# Patient Record
Sex: Female | Born: 1945
Health system: Southern US, Community
[De-identification: ages and names within clinical notes are randomized; demographics above are authoritative.]

## PROBLEM LIST (undated history)

## (undated) DIAGNOSIS — G9389 Other specified disorders of brain: Secondary | ICD-10-CM

## (undated) DIAGNOSIS — M545 Low back pain, unspecified: Secondary | ICD-10-CM

## (undated) DIAGNOSIS — Z9889 Other specified postprocedural states: Secondary | ICD-10-CM

## (undated) DIAGNOSIS — G459 Transient cerebral ischemic attack, unspecified: Secondary | ICD-10-CM

## (undated) DIAGNOSIS — S2239XA Fracture of one rib, unspecified side, initial encounter for closed fracture: Secondary | ICD-10-CM

## (undated) DIAGNOSIS — M1990 Unspecified osteoarthritis, unspecified site: Secondary | ICD-10-CM

## (undated) DIAGNOSIS — R112 Nausea with vomiting, unspecified: Secondary | ICD-10-CM

## (undated) DIAGNOSIS — R011 Cardiac murmur, unspecified: Secondary | ICD-10-CM

## (undated) DIAGNOSIS — R51 Headache: Secondary | ICD-10-CM

## (undated) DIAGNOSIS — R4701 Aphasia: Secondary | ICD-10-CM

## (undated) DIAGNOSIS — C50911 Malignant neoplasm of unspecified site of right female breast: Secondary | ICD-10-CM

## (undated) DIAGNOSIS — R569 Unspecified convulsions: Secondary | ICD-10-CM

## (undated) DIAGNOSIS — E785 Hyperlipidemia, unspecified: Secondary | ICD-10-CM

## (undated) DIAGNOSIS — Z923 Personal history of irradiation: Secondary | ICD-10-CM

## (undated) DIAGNOSIS — C7931 Secondary malignant neoplasm of brain: Secondary | ICD-10-CM

## (undated) DIAGNOSIS — G9340 Encephalopathy, unspecified: Secondary | ICD-10-CM

## (undated) DIAGNOSIS — I639 Cerebral infarction, unspecified: Secondary | ICD-10-CM

## (undated) DIAGNOSIS — F0390 Unspecified dementia without behavioral disturbance: Secondary | ICD-10-CM

## (undated) DIAGNOSIS — Z853 Personal history of malignant neoplasm of breast: Secondary | ICD-10-CM

## (undated) DIAGNOSIS — M8008XA Age-related osteoporosis with current pathological fracture, vertebra(e), initial encounter for fracture: Secondary | ICD-10-CM

## (undated) DIAGNOSIS — R2689 Other abnormalities of gait and mobility: Secondary | ICD-10-CM

## (undated) DIAGNOSIS — M810 Age-related osteoporosis without current pathological fracture: Secondary | ICD-10-CM

## (undated) DIAGNOSIS — G8929 Other chronic pain: Secondary | ICD-10-CM

## (undated) DIAGNOSIS — R296 Repeated falls: Secondary | ICD-10-CM

## (undated) DIAGNOSIS — R270 Ataxia, unspecified: Secondary | ICD-10-CM

## (undated) DIAGNOSIS — S2232XA Fracture of one rib, left side, initial encounter for closed fracture: Secondary | ICD-10-CM

## (undated) DIAGNOSIS — C50919 Malignant neoplasm of unspecified site of unspecified female breast: Secondary | ICD-10-CM

## (undated) DIAGNOSIS — R519 Headache, unspecified: Secondary | ICD-10-CM

## (undated) DIAGNOSIS — K219 Gastro-esophageal reflux disease without esophagitis: Secondary | ICD-10-CM

## (undated) HISTORY — DX: Age-related osteoporosis with current pathological fracture, vertebra(e), initial encounter for fracture: M80.08XA

## (undated) HISTORY — DX: Encephalopathy, unspecified: G93.40

## (undated) HISTORY — DX: Fracture of one rib, left side, initial encounter for closed fracture: S22.32XA

## (undated) HISTORY — PX: CHOLECYSTECTOMY OPEN: SUR202

## (undated) HISTORY — DX: Personal history of malignant neoplasm of breast: Z85.3

## (undated) HISTORY — DX: Aphasia: R47.01

## (undated) HISTORY — DX: Transient cerebral ischemic attack, unspecified: G45.9

## (undated) HISTORY — DX: Repeated falls: R29.6

## (undated) HISTORY — DX: Age-related osteoporosis without current pathological fracture: M81.0

## (undated) HISTORY — DX: Personal history of irradiation: Z92.3

## (undated) HISTORY — DX: Secondary malignant neoplasm of brain: C79.31

## (undated) HISTORY — DX: Other abnormalities of gait and mobility: R26.89

## (undated) HISTORY — DX: Cerebral infarction, unspecified: I63.9

## (undated) HISTORY — PX: APPENDECTOMY: SHX54

## (undated) HISTORY — DX: Other specified disorders of brain: G93.89

## (undated) HISTORY — DX: Ataxia, unspecified: R27.0

## (undated) HISTORY — DX: Malignant neoplasm of unspecified site of right female breast: C50.911

## (undated) HISTORY — PX: BREAST SURGERY: SHX581

---

## 1983-12-11 HISTORY — PX: ABDOMINAL HYSTERECTOMY: SHX81

## 1999-12-11 DIAGNOSIS — C50911 Malignant neoplasm of unspecified site of right female breast: Secondary | ICD-10-CM

## 1999-12-11 HISTORY — PX: MASTECTOMY: SHX3

## 1999-12-11 HISTORY — DX: Malignant neoplasm of unspecified site of right female breast: C50.911

## 2002-12-10 DIAGNOSIS — Z923 Personal history of irradiation: Secondary | ICD-10-CM

## 2002-12-10 DIAGNOSIS — C50919 Malignant neoplasm of unspecified site of unspecified female breast: Secondary | ICD-10-CM

## 2002-12-10 HISTORY — PX: BRAIN TUMOR EXCISION: SHX577

## 2002-12-10 HISTORY — DX: Malignant neoplasm of unspecified site of unspecified female breast: C50.919

## 2002-12-10 HISTORY — DX: Personal history of irradiation: Z92.3

## 2013-05-01 DIAGNOSIS — C50911 Malignant neoplasm of unspecified site of right female breast: Secondary | ICD-10-CM

## 2013-05-01 HISTORY — DX: Malignant neoplasm of unspecified site of right female breast: C50.911

## 2015-11-16 DIAGNOSIS — G459 Transient cerebral ischemic attack, unspecified: Secondary | ICD-10-CM

## 2015-11-16 HISTORY — DX: Transient cerebral ischemic attack, unspecified: G45.9

## 2017-01-27 DIAGNOSIS — R4701 Aphasia: Secondary | ICD-10-CM | POA: Insufficient documentation

## 2017-01-27 HISTORY — DX: Aphasia: R47.01

## 2017-08-19 DIAGNOSIS — R270 Ataxia, unspecified: Secondary | ICD-10-CM

## 2017-08-19 HISTORY — DX: Ataxia, unspecified: R27.0

## 2017-08-19 LAB — HM HEPATITIS C SCREENING LAB: HM HEPATITIS C SCREENING: NEGATIVE

## 2017-08-26 ENCOUNTER — Emergency Department (HOSPITAL_BASED_OUTPATIENT_CLINIC_OR_DEPARTMENT_OTHER)
Admission: EM | Admit: 2017-08-26 | Discharge: 2017-08-26 | Disposition: A | Discharge status: Home / Self Care | Payer: Medicare Other | Attending: Emergency Medicine | Admitting: Emergency Medicine

## 2017-08-26 ENCOUNTER — Emergency Department (HOSPITAL_BASED_OUTPATIENT_CLINIC_OR_DEPARTMENT_OTHER): Payer: Medicare Other

## 2017-08-26 ENCOUNTER — Encounter (HOSPITAL_BASED_OUTPATIENT_CLINIC_OR_DEPARTMENT_OTHER): Payer: Self-pay | Admitting: Emergency Medicine

## 2017-08-26 DIAGNOSIS — Z853 Personal history of malignant neoplasm of breast: Secondary | ICD-10-CM | POA: Diagnosis not present

## 2017-08-26 DIAGNOSIS — Y929 Unspecified place or not applicable: Secondary | ICD-10-CM | POA: Insufficient documentation

## 2017-08-26 DIAGNOSIS — R296 Repeated falls: Secondary | ICD-10-CM | POA: Insufficient documentation

## 2017-08-26 DIAGNOSIS — Y999 Unspecified external cause status: Secondary | ICD-10-CM | POA: Insufficient documentation

## 2017-08-26 DIAGNOSIS — S20211A Contusion of right front wall of thorax, initial encounter: Secondary | ICD-10-CM | POA: Diagnosis not present

## 2017-08-26 DIAGNOSIS — S2249XA Multiple fractures of ribs, unspecified side, initial encounter for closed fracture: Secondary | ICD-10-CM

## 2017-08-26 DIAGNOSIS — R260 Ataxic gait: Secondary | ICD-10-CM | POA: Insufficient documentation

## 2017-08-26 DIAGNOSIS — W0110XA Fall on same level from slipping, tripping and stumbling with subsequent striking against unspecified object, initial encounter: Secondary | ICD-10-CM | POA: Diagnosis not present

## 2017-08-26 DIAGNOSIS — S29001A Unspecified injury of muscle and tendon of front wall of thorax, initial encounter: Secondary | ICD-10-CM | POA: Diagnosis present

## 2017-08-26 DIAGNOSIS — C7931 Secondary malignant neoplasm of brain: Secondary | ICD-10-CM | POA: Diagnosis not present

## 2017-08-26 DIAGNOSIS — Y9301 Activity, walking, marching and hiking: Secondary | ICD-10-CM | POA: Insufficient documentation

## 2017-08-26 HISTORY — DX: Secondary malignant neoplasm of brain: C79.31

## 2017-08-26 HISTORY — DX: Malignant neoplasm of unspecified site of unspecified female breast: C50.919

## 2017-08-26 HISTORY — DX: Multiple fractures of ribs, unspecified side, initial encounter for closed fracture: S22.49XA

## 2017-08-26 HISTORY — DX: Hyperlipidemia, unspecified: E78.5

## 2017-08-26 HISTORY — DX: Unspecified dementia, unspecified severity, without behavioral disturbance, psychotic disturbance, mood disturbance, and anxiety: F03.90

## 2017-08-26 LAB — CBC WITH DIFFERENTIAL/PLATELET
BASOS PCT: 0 %
Basophils Absolute: 0 10*3/uL (ref 0.0–0.1)
EOS ABS: 0.3 10*3/uL (ref 0.0–0.7)
EOS PCT: 3 %
HCT: 40.2 % (ref 36.0–46.0)
Hemoglobin: 13.2 g/dL (ref 12.0–15.0)
LYMPHS ABS: 2.1 10*3/uL (ref 0.7–4.0)
Lymphocytes Relative: 24 %
MCH: 28.4 pg (ref 26.0–34.0)
MCHC: 32.8 g/dL (ref 30.0–36.0)
MCV: 86.5 fL (ref 78.0–100.0)
MONO ABS: 0.6 10*3/uL (ref 0.1–1.0)
MONOS PCT: 7 %
Neutro Abs: 5.8 10*3/uL (ref 1.7–7.7)
Neutrophils Relative %: 66 %
PLATELETS: 296 10*3/uL (ref 150–400)
RBC: 4.65 MIL/uL (ref 3.87–5.11)
RDW: 14.9 % (ref 11.5–15.5)
WBC: 8.9 10*3/uL (ref 4.0–10.5)

## 2017-08-26 LAB — COMPREHENSIVE METABOLIC PANEL
ALK PHOS: 73 U/L (ref 38–126)
ALT: 19 U/L (ref 14–54)
AST: 22 U/L (ref 15–41)
Albumin: 3.9 g/dL (ref 3.5–5.0)
Anion gap: 7 (ref 5–15)
BUN: 16 mg/dL (ref 6–20)
CALCIUM: 8.9 mg/dL (ref 8.9–10.3)
CHLORIDE: 108 mmol/L (ref 101–111)
CO2: 26 mmol/L (ref 22–32)
CREATININE: 0.71 mg/dL (ref 0.44–1.00)
GFR calc Af Amer: >60 mL/min (ref >60)
GFR calc non Af Amer: >60 mL/min (ref >60)
GLUCOSE: 95 mg/dL (ref 65–99)
Potassium: 3.5 mmol/L (ref 3.5–5.1)
SODIUM: 141 mmol/L (ref 135–145)
Total Bilirubin: 0.8 mg/dL (ref 0.3–1.2)
Total Protein: 7.2 g/dL (ref 6.5–8.1)

## 2017-08-26 MED ORDER — HYDROCODONE-ACETAMINOPHEN 5-325 MG PO TABS: 1.0000 | ORAL_TABLET | Freq: Two times a day (BID) | ORAL | 0 refills | Status: DC | PRN

## 2017-08-26 MED ORDER — ACETAMINOPHEN ER 650 MG PO TBCR: 650.0000 mg | EXTENDED_RELEASE_TABLET | Freq: Three times a day (TID) | ORAL | 0 refills | Status: AC | PRN

## 2017-08-26 MED ORDER — ONDANSETRON HCL 4 MG/2ML IJ SOLN
4.0000 mg | Freq: Once | INTRAMUSCULAR | Status: AC
  Administered 2017-08-26: 4 mg via INTRAVENOUS
  Filled 2017-08-26: qty 2

## 2017-08-26 MED ORDER — ACETAMINOPHEN ER 650 MG PO TBCR: 650.0000 mg | EXTENDED_RELEASE_TABLET | Freq: Three times a day (TID) | ORAL | 0 refills | Status: DC | PRN

## 2017-08-26 MED ORDER — FENTANYL CITRATE (PF) 100 MCG/2ML IJ SOLN
50.0000 ug | Freq: Once | INTRAMUSCULAR | Status: AC
  Administered 2017-08-26: 50 ug via INTRAVENOUS
  Filled 2017-08-26: qty 2

## 2017-08-26 MED ORDER — METHOCARBAMOL 500 MG PO TABS: 500.0000 mg | ORAL_TABLET | Freq: Two times a day (BID) | ORAL | 0 refills | Status: DC

## 2017-08-26 MED ORDER — METHOCARBAMOL 500 MG PO TABS: 500.0000 mg | ORAL_TABLET | Freq: Two times a day (BID) | ORAL | 0 refills | Status: AC

## 2017-08-26 MED FILL — METHOCARBAMOL 500 MG TABLET: 500 | 10 days supply | Qty: 20 | Fill #0

## 2017-08-26 MED FILL — HYDROCODON-APAP 5-325: 5-325 | 4 days supply | Qty: 8 | Fill #0

## 2017-08-26 NOTE — ED Triage Notes (Signed)
Per daughter patient from [place].  States multiple falls over the past few weeks.  Reports that she takes 6-8 ibuprofen daily due to "something always hurting".  Denies any head injuries, LOC.  Reports difficulty with balance and patient refuses to use cane/walker to get around although she has both of these at home.

## 2017-08-26 NOTE — ED Notes (Signed)
ED Provider at bedside. 

## 2017-08-26 NOTE — Discharge Instructions (Signed)
Please take over the counter and the prescribed tylenol for pain. Take the muscle relaxant as well for the chest pain.  Take the vicodin only if the pain is severe. Please be aware of the side effects of the vicodin - it certainly increases the chances of your fall.  See a Neurologist for optimal evaluation of the gait. Ct scan is normal, but you might need further testing and imaging.

## 2017-08-26 NOTE — ED Notes (Signed)
Attempted to obtain urine specimen.  Per EDP hold on UA.

## 2017-08-26 NOTE — ED Notes (Signed)
Patient transported to xray and CT. 

## 2017-08-27 NOTE — ED Provider Notes (Signed)
[facility] DEPT Provider Note   CSN: [id number] Arrival date & time: 08/26/17  1207     History   Chief Complaint Chief Complaint  Patient presents with  . Fall    HPI Cynthia Massey is a 71 y.o. female.  HPI Pt comes in with cc of fall. Pt has metastatic cancer to the brain history and dementia. Family reports that pt has had increased falls over the last 2 weeks. She has been getting more unsteady with gait and leans towards right. Pt is having L sided chest pain from the fall and back pain. Not on blood thinners. Pt visiting from [place], about to get home care started soon.  Past Medical History:  Diagnosis Date  . Breast cancer metastasized to brain ([place])   . Dementia   . Hyperlipidemia     There are no active problems to display for this patient.   Past Surgical History:  Procedure Laterality Date  . CHOLECYSTECTOMY    . MASTECTOMY      OB History    No data available       Home Medications    Prior to Admission medications   Medication Sig Start Date End Date Taking? Authorizing Provider  acetaminophen (TYLENOL 8 HOUR) 650 MG CR tablet Take 1 tablet (650 mg total) by mouth every 8 (eight) hours as needed for pain. 08/26/17   Varney Biles, MD  HYDROcodone-acetaminophen (NORCO/VICODIN) 5-325 MG tablet Take 1 tablet by mouth every 12 (twelve) hours as needed for severe pain. 08/26/17   Varney Biles, MD  methocarbamol (ROBAXIN) 500 MG tablet Take 1 tablet (500 mg total) by mouth 2 (two) times daily. 08/26/17   Varney Biles, MD    Family History History reviewed. No pertinent family history.  Social History Social History  Substance Use Topics  . Smoking status: Never Smoker  . Smokeless tobacco: Never Used  . Alcohol use No     Allergies   Morphine and related   Review of Systems Review of Systems  All other systems reviewed and are negative.    Physical Exam Updated Vital Signs BP (!) 152/77 (BP Location: Right Arm)    Pulse 66   Temp 98.3 F (36.8 C) (Oral)   Resp 18   Ht 5\' 7"  (1.702 m)   Wt 70.8 kg (156 lb)   SpO2 100%   BMI 24.43 kg/m   Physical Exam  Constitutional: She is oriented to person, place, and time. She appears well-developed.  HENT:  Head: Normocephalic and atraumatic.  Eyes: EOM are normal.  Neck: Normal range of motion. Neck supple.  No midline c-spine tenderness, pt able to turn head to 45 degrees bilaterally without any pain and able to flex neck to the chest and extend without any pain or neurologic symptoms.   Cardiovascular: Normal rate.   Pulmonary/Chest: Effort normal. She exhibits tenderness.  Abdominal: Bowel sounds are normal. There is no tenderness. There is no guarding.  Neurological: She is alert and oriented to person, place, and time. No cranial nerve deficit. Coordination normal.  Cerebellar exam is normal (finger to nose) Sensory exam normal for bilateral upper and lower extremities - and patient is able to discriminate between sharp and dull. Motor exam is 4+/5   Skin: Skin is warm and dry.  Nursing note and vitals reviewed.    ED Treatments / Results  Labs (all labs ordered are listed, but only abnormal results are displayed) Labs Reviewed  COMPREHENSIVE METABOLIC PANEL  CBC WITH DIFFERENTIAL/PLATELET  EKG  EKG Interpretation None       Radiology Dg Ribs Unilateral W/chest Left  Result Date: 08/26/2017 CLINICAL DATA:  Fall 2 days ago. Left posterior rib pain and bruising. Initial encounter. EXAM: LEFT RIBS AND CHEST - 3+ VIEW COMPARISON:  None. FINDINGS: No fracture or other bone lesions are seen involving the ribs. There is no evidence of pneumothorax or pleural effusion. Both lungs are clear. Heart size and mediastinal contours are within normal limits. Surgical clips again noted bilaterally. IMPRESSION: No acute findings. Electronically Signed   By: Earle Gell M.D.   On: 08/26/2017 14:13   Ct Head Wo Contrast  Result Date:  08/26/2017 CLINICAL DATA:  Multiple falls over the past week. No reported LOC. Dementia. Breast cancer. Imbalance. EXAM: CT HEAD WITHOUT CONTRAST TECHNIQUE: Contiguous axial images were obtained from the base of the skull through the vertex without intravenous contrast. COMPARISON:  None. FINDINGS: Brain: No acute stroke, acute hemorrhage, mass lesion, hydrocephalus, or extra-axial fluid. Advanced atrophy. Extensive hypoattenuation of white matter, likely chronic microvascular ischemic change and/or post treatment effect. RIGHT cerebellar encephalomalacia. Given the skull defect, this is likely postsurgical, in this patient with history of breast cancer and previous brain metastasis. Vascular: Calcification of the cavernous internal carotid arteries consistent with cerebrovascular atherosclerotic disease. No signs of intracranial large vessel occlusion. Skull: RIGHT cerebellar craniotomy defect. No osseous metastatic disease. Sinuses/Orbits: Negative. Other: None. IMPRESSION: Advanced brain substance loss. Extensive hypoattenuation of white matter. No posttraumatic sequelae are evident. Postsurgical change RIGHT cerebellum, likely related to metastatic breast cancer. No features to suggest intracranial mass lesion on today's study. Electronically Signed   By: Staci Righter M.D.   On: 08/26/2017 14:14    Procedures Procedures (including critical care time)  Medications Ordered in ED Medications  fentaNYL (SUBLIMAZE) injection 50 mcg (50 mcg Intravenous Given 08/26/17 1417)  ondansetron (ZOFRAN) injection 4 mg (4 mg Intravenous Given 08/26/17 1417)     Initial Impression / Assessment and Plan / ED Course  I have reviewed the triage vital signs and the nursing notes.  Pertinent labs & imaging results that were available during my care of the patient were reviewed by me and considered in my medical decision making (see chart for details).     Pt with fall. Imaging ordered to r/o brain bleed or new  mets. xrays also ordered.  Pt will see pcp if workup here neg and work with home health care, family prefers that plan as well.  Final Clinical Impressions(s) / ED Diagnoses   Final diagnoses:  Rib contusion, right, initial encounter  Ataxic gait  Multiple falls    New Prescriptions Discharge Medication List as of 08/26/2017  3:10 PM       Varney Biles, MD 08/27/17 1728

## 2017-08-28 ENCOUNTER — Emergency Department (HOSPITAL_BASED_OUTPATIENT_CLINIC_OR_DEPARTMENT_OTHER): Payer: Medicare Other

## 2017-08-28 ENCOUNTER — Encounter (HOSPITAL_BASED_OUTPATIENT_CLINIC_OR_DEPARTMENT_OTHER): Payer: Self-pay | Admitting: Emergency Medicine

## 2017-08-28 ENCOUNTER — Emergency Department (HOSPITAL_BASED_OUTPATIENT_CLINIC_OR_DEPARTMENT_OTHER)
Admission: EM | Admit: 2017-08-28 | Discharge: 2017-08-28 | Disposition: A | Discharge status: Home / Self Care | Payer: Medicare Other | Source: Home / Self Care | Attending: Emergency Medicine | Admitting: Emergency Medicine

## 2017-08-28 DIAGNOSIS — Y939 Activity, unspecified: Secondary | ICD-10-CM

## 2017-08-28 DIAGNOSIS — Z853 Personal history of malignant neoplasm of breast: Secondary | ICD-10-CM

## 2017-08-28 DIAGNOSIS — Y929 Unspecified place or not applicable: Secondary | ICD-10-CM

## 2017-08-28 DIAGNOSIS — S2242XA Multiple fractures of ribs, left side, initial encounter for closed fracture: Secondary | ICD-10-CM | POA: Insufficient documentation

## 2017-08-28 DIAGNOSIS — C7931 Secondary malignant neoplasm of brain: Secondary | ICD-10-CM

## 2017-08-28 DIAGNOSIS — R1012 Left upper quadrant pain: Secondary | ICD-10-CM

## 2017-08-28 DIAGNOSIS — R0789 Other chest pain: Secondary | ICD-10-CM | POA: Insufficient documentation

## 2017-08-28 DIAGNOSIS — X58XXXA Exposure to other specified factors, initial encounter: Secondary | ICD-10-CM

## 2017-08-28 DIAGNOSIS — Y999 Unspecified external cause status: Secondary | ICD-10-CM | POA: Insufficient documentation

## 2017-08-28 LAB — COMPREHENSIVE METABOLIC PANEL
ALT: 18 U/L (ref 14–54)
AST: 21 U/L (ref 15–41)
Albumin: 3.6 g/dL (ref 3.5–5.0)
Alkaline Phosphatase: 75 U/L (ref 38–126)
Anion gap: 5 (ref 5–15)
BUN: 18 mg/dL (ref 6–20)
CHLORIDE: 107 mmol/L (ref 101–111)
CO2: 26 mmol/L (ref 22–32)
CREATININE: 0.74 mg/dL (ref 0.44–1.00)
Calcium: 9.2 mg/dL (ref 8.9–10.3)
GFR calc Af Amer: >60 mL/min (ref >60)
Glucose, Bld: 110 mg/dL — ABNORMAL HIGH (ref 65–99)
Potassium: 4 mmol/L (ref 3.5–5.1)
SODIUM: 138 mmol/L (ref 135–145)
Total Bilirubin: 0.6 mg/dL (ref 0.3–1.2)
Total Protein: 6.8 g/dL (ref 6.5–8.1)

## 2017-08-28 LAB — CBC WITH DIFFERENTIAL/PLATELET
Basophils Absolute: 0 10*3/uL (ref 0.0–0.1)
Basophils Relative: 0 %
Eosinophils Absolute: 0.2 10*3/uL (ref 0.0–0.7)
Eosinophils Relative: 3 %
HCT: 36.4 % (ref 36.0–46.0)
HEMOGLOBIN: 12 g/dL (ref 12.0–15.0)
LYMPHS PCT: 18 %
Lymphs Abs: 1.2 10*3/uL (ref 0.7–4.0)
MCH: 28.7 pg (ref 26.0–34.0)
MCHC: 33 g/dL (ref 30.0–36.0)
MCV: 87.1 fL (ref 78.0–100.0)
MONOS PCT: 8 %
Monocytes Absolute: 0.5 10*3/uL (ref 0.1–1.0)
Neutro Abs: 4.8 10*3/uL (ref 1.7–7.7)
Neutrophils Relative %: 71 %
PLATELETS: 269 10*3/uL (ref 150–400)
RBC: 4.18 MIL/uL (ref 3.87–5.11)
RDW: 14.6 % (ref 11.5–15.5)
WBC: 6.7 10*3/uL (ref 4.0–10.5)

## 2017-08-28 MED ORDER — IOPAMIDOL (ISOVUE-300) INJECTION 61%
100.0000 mL | Freq: Once | INTRAVENOUS | Status: AC | PRN
  Administered 2017-08-28: 100 mL via INTRAVENOUS

## 2017-08-28 MED ORDER — LIDOCAINE 5 % EX PTCH: 1.0000 | MEDICATED_PATCH | CUTANEOUS | 0 refills | Status: AC

## 2017-08-28 MED ORDER — OXYCODONE-ACETAMINOPHEN 5-325 MG PO TABS: 2.0000 | ORAL_TABLET | ORAL | 0 refills | Status: DC | PRN

## 2017-08-28 MED ORDER — DOCUSATE SODIUM 100 MG PO CAPS: 100.0000 mg | ORAL_CAPSULE | Freq: Two times a day (BID) | ORAL | 0 refills | Status: AC

## 2017-08-28 MED FILL — DOK 100 MG SOFTGEL: 100 | 50 days supply | Qty: 100 | Fill #0

## 2017-08-28 MED FILL — OXYCODONE-ACETAMINOPHEN 5-3: 5-325 | 2 days supply | Qty: 15 | Fill #0
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
## 2017-08-28 NOTE — Discharge Instructions (Signed)
Your workup today showed that you have a fracture to one or more ribs.  Unfortunately this type of injury hurts but there is no way to fix it immediately; it must heal over time.  Be sure to take plenty of deep breaths so that you get rid of the "bad air" in your lungs.  If you are given a device called an incentive spirometer, please use it as recommended.  Unless you have been told by your doctor not to do so, we recommend you take ibuprofen 600 mg 3 times daily with meals for no more than 5 days.  You can also take Tylenol 1000 mg every 6 hours for pain.  Follow-up at the clinics or with the doctors described in this paperwork.  Return to the emergency department if he develop new or worsening symptoms that concern you.   Rib Fracture A rib fracture is a break or crack in one of the bones of the ribs. The ribs are a group of Tayden Duran, curved bones that wrap around your chest and attach to your spine. They protect your lungs and other organs in the chest cavity. A broken or cracked rib is often painful, but most do not cause other problems. Most rib fractures heal on their own over time. However, rib fractures can be more serious if multiple ribs are broken or if broken ribs move out of place and push against other structures. CAUSES  A direct blow to the chest. For example, this could happen during contact sports, a car accident, or a fall against a hard object. Repetitive movements with high force, such as pitching a baseball or having severe coughing spells. SYMPTOMS  Pain when you breathe in or cough. Pain when someone presses on the injured area. DIAGNOSIS  Your caregiver will perform a physical exam. Various imaging tests may be ordered to confirm the diagnosis and to look for related injuries. These tests may include a chest X-ray, computed tomography (CT), magnetic resonance imaging (MRI), or a bone scan. TREATMENT  Rib fractures usually heal on their own in 1-3 months. The longer healing  period is often associated with a continued cough or other aggravating activities. During the healing period, pain control is very important. Medication is usually given to control pain. Hospitalization or surgery may be needed for more severe injuries, such as those in which multiple ribs are broken or the ribs have moved out of place.  HOME CARE INSTRUCTIONS  Avoid strenuous activity and any activities or movements that cause pain. Be careful during activities and avoid bumping the injured rib. Gradually increase activity as directed by your caregiver. Only take over-the-counter or prescription medications as directed by your caregiver. Do not take other medications without asking your caregiver first. Apply ice to the injured area for the first 1-2 days after you have been treated or as directed by your caregiver. Applying ice helps to reduce inflammation and pain. Put ice in a plastic bag. Place a towel between your skin and the bag.   Leave the ice on for 15-20 minutes at a time, every 2 hours while you are awake. Perform deep breathing as directed by your caregiver. This will help prevent pneumonia, which is a common complication of a broken rib. Your caregiver may instruct you to: Take deep breaths several times a day. Try to cough several times a day, holding a pillow against the injured area. Use a device called an incentive spirometer to practice deep breathing several times a day. Drink   enough fluids to keep your urine clear or pale yellow. This will help you avoid constipation.   Do not wear a rib belt or binder. These restrict breathing, which can lead to pneumonia.   SEEK IMMEDIATE MEDICAL CARE IF:  You have a fever.   You have difficulty breathing or shortness of breath.   You develop a continual cough, or you cough up thick or bloody sputum. You feel sick to your stomach (nausea), throw up (vomit), or have abdominal pain.   You have worsening pain not controlled with medications.    MAKE SURE YOU: Understand these instructions. Will watch your condition. Will get help right away if you are not doing well or get worse. Document Released: 11/26/2005 Document Revised: 07/29/2013 Document Reviewed: 01/28/2013 Exit[employer] Patient Information 2015 [employer]. This information is not intended to replace advice given to you by your health care provider. Make sure you discuss any questions you have with your health care provider.   

## 2017-08-28 NOTE — ED Triage Notes (Signed)
Patient reports back to ER after fall again Monday after leaving ER.  Reports pain to entire left side.  States she took on vicodin this morning without relief.

## 2017-08-28 NOTE — ED Notes (Signed)
IS given at this time. Best effort was 1057ml. Given to patient's husband to take home

## 2017-08-28 NOTE — ED Notes (Signed)
ED Provider at bedside. 

## 2017-08-29 ENCOUNTER — Inpatient Hospital Stay (HOSPITAL_COMMUNITY)
Admission: EM | Admit: 2017-08-29 | Discharge: 2017-09-03 | DRG: 101 | Disposition: A | Discharge status: Skilled Nursing Facility | Payer: Medicare Other | Attending: Family Medicine | Admitting: Family Medicine

## 2017-08-29 ENCOUNTER — Encounter (HOSPITAL_COMMUNITY): Payer: Self-pay | Admitting: Emergency Medicine

## 2017-08-29 ENCOUNTER — Emergency Department (HOSPITAL_COMMUNITY): Payer: Medicare Other

## 2017-08-29 DIAGNOSIS — R29704 NIHSS score 4: Secondary | ICD-10-CM | POA: Diagnosis present

## 2017-08-29 DIAGNOSIS — Z853 Personal history of malignant neoplasm of breast: Secondary | ICD-10-CM | POA: Diagnosis not present

## 2017-08-29 DIAGNOSIS — Z87898 Personal history of other specified conditions: Secondary | ICD-10-CM | POA: Diagnosis not present

## 2017-08-29 DIAGNOSIS — R4182 Altered mental status, unspecified: Secondary | ICD-10-CM | POA: Diagnosis present

## 2017-08-29 DIAGNOSIS — R079 Chest pain, unspecified: Secondary | ICD-10-CM

## 2017-08-29 DIAGNOSIS — R32 Unspecified urinary incontinence: Secondary | ICD-10-CM | POA: Diagnosis not present

## 2017-08-29 DIAGNOSIS — G40209 Localization-related (focal) (partial) symptomatic epilepsy and epileptic syndromes with complex partial seizures, not intractable, without status epilepticus: Secondary | ICD-10-CM | POA: Diagnosis not present

## 2017-08-29 DIAGNOSIS — M810 Age-related osteoporosis without current pathological fracture: Secondary | ICD-10-CM | POA: Diagnosis present

## 2017-08-29 DIAGNOSIS — L8990 Pressure ulcer of unspecified site, unspecified stage: Secondary | ICD-10-CM | POA: Insufficient documentation

## 2017-08-29 DIAGNOSIS — R569 Unspecified convulsions: Secondary | ICD-10-CM

## 2017-08-29 DIAGNOSIS — N390 Urinary tract infection, site not specified: Secondary | ICD-10-CM | POA: Diagnosis present

## 2017-08-29 DIAGNOSIS — F0390 Unspecified dementia without behavioral disturbance: Secondary | ICD-10-CM | POA: Diagnosis present

## 2017-08-29 DIAGNOSIS — R29810 Facial weakness: Secondary | ICD-10-CM | POA: Diagnosis present

## 2017-08-29 DIAGNOSIS — I7389 Other specified peripheral vascular diseases: Secondary | ICD-10-CM | POA: Diagnosis present

## 2017-08-29 DIAGNOSIS — E876 Hypokalemia: Secondary | ICD-10-CM | POA: Diagnosis not present

## 2017-08-29 DIAGNOSIS — S2232XA Fracture of one rib, left side, initial encounter for closed fracture: Secondary | ICD-10-CM | POA: Diagnosis present

## 2017-08-29 DIAGNOSIS — R55 Syncope and collapse: Secondary | ICD-10-CM | POA: Diagnosis present

## 2017-08-29 DIAGNOSIS — Z79899 Other long term (current) drug therapy: Secondary | ICD-10-CM

## 2017-08-29 DIAGNOSIS — C7931 Secondary malignant neoplasm of brain: Secondary | ICD-10-CM | POA: Diagnosis present

## 2017-08-29 DIAGNOSIS — H518 Other specified disorders of binocular movement: Secondary | ICD-10-CM | POA: Diagnosis present

## 2017-08-29 DIAGNOSIS — S2242XA Multiple fractures of ribs, left side, initial encounter for closed fracture: Secondary | ICD-10-CM | POA: Diagnosis present

## 2017-08-29 DIAGNOSIS — R2689 Other abnormalities of gait and mobility: Secondary | ICD-10-CM | POA: Diagnosis present

## 2017-08-29 DIAGNOSIS — M8008XA Age-related osteoporosis with current pathological fracture, vertebra(e), initial encounter for fracture: Secondary | ICD-10-CM | POA: Diagnosis present

## 2017-08-29 DIAGNOSIS — E785 Hyperlipidemia, unspecified: Secondary | ICD-10-CM | POA: Diagnosis present

## 2017-08-29 DIAGNOSIS — R296 Repeated falls: Secondary | ICD-10-CM | POA: Diagnosis present

## 2017-08-29 DIAGNOSIS — B9620 Unspecified Escherichia coli [E. coli] as the cause of diseases classified elsewhere: Secondary | ICD-10-CM | POA: Diagnosis present

## 2017-08-29 DIAGNOSIS — R159 Full incontinence of feces: Secondary | ICD-10-CM | POA: Diagnosis not present

## 2017-08-29 DIAGNOSIS — Z923 Personal history of irradiation: Secondary | ICD-10-CM

## 2017-08-29 DIAGNOSIS — Z885 Allergy status to narcotic agent status: Secondary | ICD-10-CM

## 2017-08-29 DIAGNOSIS — Z9013 Acquired absence of bilateral breasts and nipples: Secondary | ICD-10-CM

## 2017-08-29 DIAGNOSIS — W1830XA Fall on same level, unspecified, initial encounter: Secondary | ICD-10-CM | POA: Diagnosis not present

## 2017-08-29 DIAGNOSIS — Y92231 Patient bathroom in hospital as the place of occurrence of the external cause: Secondary | ICD-10-CM | POA: Diagnosis not present

## 2017-08-29 DIAGNOSIS — I69320 Aphasia following cerebral infarction: Secondary | ICD-10-CM

## 2017-08-29 DIAGNOSIS — G9389 Other specified disorders of brain: Secondary | ICD-10-CM

## 2017-08-29 DIAGNOSIS — R269 Unspecified abnormalities of gait and mobility: Secondary | ICD-10-CM | POA: Diagnosis present

## 2017-08-29 HISTORY — DX: Gastro-esophageal reflux disease without esophagitis: K21.9

## 2017-08-29 HISTORY — DX: Headache: R51

## 2017-08-29 HISTORY — DX: Unspecified convulsions: R56.9

## 2017-08-29 HISTORY — DX: Headache, unspecified: R51.9

## 2017-08-29 HISTORY — DX: Low back pain, unspecified: M54.50

## 2017-08-29 HISTORY — DX: Other chronic pain: G89.29

## 2017-08-29 HISTORY — DX: Other specified postprocedural states: Z98.890

## 2017-08-29 HISTORY — DX: Fracture of one rib, unspecified side, initial encounter for closed fracture: S22.39XA

## 2017-08-29 HISTORY — DX: Other specified postprocedural states: R11.2

## 2017-08-29 HISTORY — DX: Unspecified osteoarthritis, unspecified site: M19.90

## 2017-08-29 HISTORY — DX: Malignant neoplasm of unspecified site of right female breast: C50.911

## 2017-08-29 HISTORY — DX: Low back pain: M54.5

## 2017-08-29 HISTORY — DX: Cardiac murmur, unspecified: R01.1

## 2017-08-29 LAB — CBC
HEMATOCRIT: 37.7 % (ref 36.0–46.0)
HEMOGLOBIN: 12 g/dL (ref 12.0–15.0)
MCH: 27.6 pg (ref 26.0–34.0)
MCHC: 31.8 g/dL (ref 30.0–36.0)
MCV: 86.9 fL (ref 78.0–100.0)
Platelets: 266 10*3/uL (ref 150–400)
RBC: 4.34 MIL/uL (ref 3.87–5.11)
RDW: 14.5 % (ref 11.5–15.5)
WBC: 9.1 10*3/uL (ref 4.0–10.5)

## 2017-08-29 LAB — COMPREHENSIVE METABOLIC PANEL
ALT: 51 U/L (ref 14–54)
AST: 46 U/L — AB (ref 15–41)
Albumin: 3.6 g/dL (ref 3.5–5.0)
Alkaline Phosphatase: 100 U/L (ref 38–126)
Anion gap: 5 (ref 5–15)
BILIRUBIN TOTAL: 0.6 mg/dL (ref 0.3–1.2)
BUN: 18 mg/dL (ref 6–20)
CHLORIDE: 106 mmol/L (ref 101–111)
CO2: 25 mmol/L (ref 22–32)
Calcium: 8.8 mg/dL — ABNORMAL LOW (ref 8.9–10.3)
Creatinine, Ser: 0.78 mg/dL (ref 0.44–1.00)
Glucose, Bld: 129 mg/dL — ABNORMAL HIGH (ref 65–99)
POTASSIUM: 3.4 mmol/L — AB (ref 3.5–5.1)
Sodium: 136 mmol/L (ref 135–145)
TOTAL PROTEIN: 6.3 g/dL — AB (ref 6.5–8.1)

## 2017-08-29 LAB — I-STAT CHEM 8, ED
BUN: 20 mg/dL (ref 6–20)
CALCIUM ION: 1.1 mmol/L — AB (ref 1.15–1.40)
Chloride: 103 mmol/L (ref 101–111)
Creatinine, Ser: 0.7 mg/dL (ref 0.44–1.00)
Glucose, Bld: 126 mg/dL — ABNORMAL HIGH (ref 65–99)
HEMATOCRIT: 36 % (ref 36.0–46.0)
HEMOGLOBIN: 12.2 g/dL (ref 12.0–15.0)
Potassium: 3.6 mmol/L (ref 3.5–5.1)
SODIUM: 139 mmol/L (ref 135–145)
TCO2: 25 mmol/L (ref 22–32)

## 2017-08-29 LAB — DIFFERENTIAL
BASOS ABS: 0 10*3/uL (ref 0.0–0.1)
BASOS PCT: 0 %
EOS ABS: 0.2 10*3/uL (ref 0.0–0.7)
EOS PCT: 2 %
LYMPHS ABS: 2.4 10*3/uL (ref 0.7–4.0)
Lymphocytes Relative: 26 %
MONOS PCT: 7 %
Monocytes Absolute: 0.6 10*3/uL (ref 0.1–1.0)
Neutro Abs: 5.9 10*3/uL (ref 1.7–7.7)
Neutrophils Relative %: 65 %

## 2017-08-29 LAB — APTT: APTT: 24 s (ref 24–36)

## 2017-08-29 LAB — PROTIME-INR
INR: 1
Prothrombin Time: 13.1 seconds (ref 11.4–15.2)

## 2017-08-29 LAB — I-STAT TROPONIN, ED: TROPONIN I, POC: 0 ng/mL (ref 0.00–0.08)

## 2017-08-29 LAB — CBG MONITORING, ED: GLUCOSE-CAPILLARY: 126 mg/dL — AB (ref 65–99)

## 2017-08-29 MED ORDER — SODIUM CHLORIDE 0.9 % IV SOLN
1000.0000 mg | Freq: Once | INTRAVENOUS | Status: AC
  Administered 2017-08-29: 1000 mg via INTRAVENOUS
  Filled 2017-08-29: qty 10

## 2017-08-29 NOTE — ED Triage Notes (Signed)
PT BIB [facility] for code stroke. Pt was having dinner and family noticed patient had a right sided facial drop. EMS states pt had a right sided gaze. Pt will look to the left but wants to go back to the right. Pt is alert but not quite orientated
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
## 2017-08-29 NOTE — Consult Note (Signed)
Admission H&P Referring physician: Dr. Rex Kras  Chief Complaint: Acute onset of unresponsiveness with eyes deviated to the right.  HPI: Cynthia Massey is an 71 y.o. 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 She was eating dinner at the time and was noted to slump to the right side. EMS described tonic deviation of eyes to the right as well as right facial droop. She began to respond after arriving in the emergency room but remained confused. No clear weakness of extremities was noted. She has no history of stroke or seizure activity. Family indicates that she has had episodes of difficulty with speech output for at least several months. CT scan of her head showed no acute intracranial abnormality. Postsurgical changes involving right cerebellar hemisphere and posterior fossa were noted. She had a previous CT scan on 08/26/2017 following a fall at home. There was no change on tonight's scan. Patient was felt to likely have experienced a complex partial seizure and was postictal and slightly more confused than at baseline. Loading dose of Keppra 1000 mg was ordered, as well as MRI of the brain without and with contrast.  Past Medical History:  Diagnosis Date  . Breast cancer metastasized to brain ([place])   . Dementia   . Hyperlipidemia     Past Surgical History:  Procedure Laterality Date  . CHOLECYSTECTOMY    . MASTECTOMY      No family history on file. Social History:  reports that she has never smoked. She has never used smokeless tobacco. She reports that she does not drink alcohol or use drugs.  Allergies:  Allergies  Allergen Reactions  . Morphine And Related Nausea And Vomiting    Medications: Preadmission medications  were reviewed by me.  ROS: Multiple rib fractures secondary to a fall 3 days ago with residual severe pain. Review of systems is otherwise unremarkable except for status changes associated with dementia.  Physical Examination: Blood pressure (!) 133/54, pulse 85, resp. rate 19, height 5\' 7"  (1.702 m), weight 70.2 kg (154 lb 12.2 oz), SpO2 97 %.  HEENT-  Normocephalic, no lesions, without obvious abnormality.  Normal external eye and conjunctiva.  Normal TM's bilaterally.  Normal auditory canals and external ears. Normal external nose, mucus membranes and septum.  Normal pharynx. Neck supple with no masses, nodes, nodules or enlargement. Cardiovascular - regular rate and rhythm, S1, S2 normal, no murmur, click, rub or gallop Lungs - chest clear, no wheezing, rales, normal symmetric air entry Abdomen - soft, non-tender; bowel sounds normal; no masses,  no organomegaly Extremities - no joint deformities, effusion, or inflammation  Neurologic Examination: Mental Status: Alert, disoriented to time as well as place, no acute distress.  There was no receptive aphasia. However she appeared to have a significant word finding difficulty and tended to lose her train of thought very easily. Able to follow commands fairly well. Cranial Nerves: II-Visual fields were difficult to assess because of her extent of confusion. Reaction to visual threat was variable bilaterally. III/IV/VI-Pupils were equal and reacted normally to light. Extraocular movements were full and conjugate.    V/VII-no facial numbness and no facial weakness. VIII-normal. X-normal speech and symmetrical palatal movement. XI: trapezius strength/neck flexion strength normal bilaterally XII-midline tongue extension with normal strength. Motor: 5/5 bilaterally with  normal tone and bulk Sensory: Normal throughout. Deep Tendon Reflexes: 2+ and symmetric. Plantars: Flexor bilaterally Cerebellar: Normal finger-to-nose testing. Carotid  auscultation: Normal  Results for orders placed or performed during the hospital encounter of 08/29/17 (from the past 48 hour(s))  Protime-INR     Status: None   Collection Time: 08/29/17  9:51 PM  Result Value Ref Range   Prothrombin Time 13.1 11.4 - 15.2 seconds   INR 1.00   APTT     Status: None   Collection Time: 08/29/17  9:51 PM  Result Value Ref Range   aPTT 24 24 - 36 seconds  CBC     Status: None   Collection Time: 08/29/17  9:51 PM  Result Value Ref Range   WBC 9.1 4.0 - 10.5 K/uL   RBC 4.34 3.87 - 5.11 MIL/uL   Hemoglobin 12.0 12.0 - 15.0 g/dL   HCT 37.7 36.0 - 46.0 %   MCV 86.9 78.0 - 100.0 fL   MCH 27.6 26.0 - 34.0 pg   MCHC 31.8 30.0 - 36.0 g/dL   RDW 14.5 11.5 - 15.5 %   Platelets 266 150 - 400 K/uL  Differential     Status: None   Collection Time: 08/29/17  9:51 PM  Result Value Ref Range   Neutrophils Relative % 65 %   Neutro Abs 5.9 1.7 - 7.7 K/uL   Lymphocytes Relative 26 %   Lymphs Abs 2.4 0.7 - 4.0 K/uL   Monocytes Relative 7 %   Monocytes Absolute 0.6 0.1 - 1.0 K/uL   Eosinophils Relative 2 %   Eosinophils Absolute 0.2 0.0 - 0.7 K/uL   Basophils Relative 0 %   Basophils Absolute 0.0 0.0 - 0.1 K/uL  CBG monitoring, ED     Status: Abnormal   Collection Time: 08/29/17  9:51 PM  Result Value Ref Range   Glucose-Capillary 126 (H) 65 - 99 mg/dL  I-stat troponin, ED     Status: None   Collection Time: 08/29/17  9:54 PM  Result Value Ref Range   Troponin i, poc 0.00 0.00 - 0.08 ng/mL   Comment 3            Comment: Due to the release kinetics of cTnI, a negative result within the first hours of the onset of symptoms does not rule out myocardial infarction with certainty. If myocardial infarction is still suspected, repeat the test at appropriate intervals.   I-Stat Chem 8, ED     Status: Abnormal   Collection Time: 08/29/17  9:56 PM  Result Value Ref Range   Sodium 139 135 - 145 mmol/L   Potassium 3.6 3.5 - 5.1 mmol/L   Chloride 103 101 - 111  mmol/L   BUN 20 6 - 20 mg/dL   Creatinine, Ser 0.70 0.44 - 1.00 mg/dL   Glucose, Bld 126 (H) 65 - 99 mg/dL   Calcium, Ion 1.10 (L) 1.15 - 1.40 mmol/L   TCO2 25 22 - 32 mmol/L   Hemoglobin 12.2 12.0 - 15.0 g/dL   HCT 36.0 36.0 - 46.0 %   Ct Chest W Contrast  Result Date: 08/28/2017 CLINICAL DATA:  Fall on Monday.  Left lower rib and back pain. EXAM: CT CHEST, ABDOMEN, AND PELVIS WITH CONTRAST TECHNIQUE: Multidetector CT imaging of the chest, abdomen and pelvis was performed following the standard protocol during bolus administration of intravenous contrast. CONTRAST:  153mL ISOVUE-300 IOPAMIDOL (ISOVUE-300) INJECTION 61% COMPARISON:  None. FINDINGS: CT CHEST FINDINGS Cardiovascular: Heart is normal size.  Aorta is normal caliber. Mediastinum/Nodes: No mediastinal, hilar, or axillary adenopathy. No evidence of mediastinal hematoma Lungs/Pleura: Biapical scarring. Scarring in the right middle lobe and lingula. Minimal left base atelectasis dependently. No effusions or pneumothorax. Musculoskeletal: Fracture through the posterolateral left ninth and tenth ribs. CT ABDOMEN PELVIS FINDINGS Hepatobiliary: No hepatic injury or perihepatic hematoma. Gallbladder is unremarkable Pancreas: No focal abnormality or ductal dilatation. Spleen: No splenic injury or perisplenic hematoma. Adrenals/Urinary Tract: No adrenal hemorrhage or renal injury identified. Bladder is unremarkable. Stomach/Bowel: Stomach, large and small bowel grossly unremarkable. Vascular/Lymphatic: Aortic and iliac calcifications. No aneurysm or adenopathy. Reproductive: No visible focal abnormality. Other: No free fluid or free air. Musculoskeletal: Compression fracture through the T12 vertebral body superior endplate, age indeterminate. IMPRESSION: Left ninth and tenth posterior lateral rib fractures. Compression fracture through the superior endplate at X10, age indeterminate. Left base atelectasis.  Areas of scarring in the lungs bilaterally. No  evidence of acute injury in the chest, abdomen or pelvis. Electronically Signed   By: Rolm Baptise M.D.   On: 08/28/2017 10:20   Ct Abdomen Pelvis W Contrast  Result Date: 08/28/2017 CLINICAL DATA:  Fall on Monday.  Left lower rib and back pain. EXAM: CT CHEST, ABDOMEN, AND PELVIS WITH CONTRAST TECHNIQUE: Multidetector CT imaging of the chest, abdomen and pelvis was performed following the standard protocol during bolus administration of intravenous contrast. CONTRAST:  172mL ISOVUE-300 IOPAMIDOL (ISOVUE-300) INJECTION 61% COMPARISON:  None. FINDINGS: CT CHEST FINDINGS Cardiovascular: Heart is normal size. Aorta is normal caliber. Mediastinum/Nodes: No mediastinal, hilar, or axillary adenopathy. No evidence of mediastinal hematoma Lungs/Pleura: Biapical scarring. Scarring in the right middle lobe and lingula. Minimal left base atelectasis dependently. No effusions or pneumothorax. Musculoskeletal: Fracture through the posterolateral left ninth and tenth ribs. CT ABDOMEN PELVIS FINDINGS Hepatobiliary: No hepatic injury or perihepatic hematoma. Gallbladder is unremarkable Pancreas: No focal abnormality or ductal dilatation. Spleen: No splenic injury or perisplenic hematoma. Adrenals/Urinary Tract: No adrenal hemorrhage or renal injury identified. Bladder is unremarkable. Stomach/Bowel: Stomach, large and small bowel grossly unremarkable. Vascular/Lymphatic: Aortic and iliac calcifications. No aneurysm or adenopathy. Reproductive: No visible focal abnormality. Other: No free fluid or free air. Musculoskeletal: Compression fracture through the T12 vertebral body superior endplate, age indeterminate. IMPRESSION: Left ninth and tenth posterior lateral rib fractures. Compression fracture through the superior endplate at G26, age indeterminate. Left base atelectasis.  Areas of scarring in the lungs bilaterally. No evidence of acute injury in the chest, abdomen or pelvis. Electronically Signed   By: Rolm Baptise M.D.    On: 08/28/2017 10:20   Ct Head Code Stroke Wo Contrast  Result Date: 08/29/2017 CLINICAL DATA:  Code stroke. Initial evaluation for acute right-sided facial droop. EXAM: CT HEAD WITHOUT CONTRAST TECHNIQUE: Contiguous axial images were obtained from the base of the skull through the vertex without intravenous contrast. COMPARISON:  Prior CT from 08/26/2017. FINDINGS: Brain: Stable atrophy with advanced cerebral white matter changes. Encephalomalacia out within the right cerebellar hemisphere, unchanged. No acute intracranial hemorrhage. No evidence for acute large vessel territory infarct. No mass lesion, midline shift or mass effect. Ventricular prominence related underlying atrophy without hydrocephalus. No extra-axial fluid collection. Vascular: No hyperdense vessel. Scattered vascular calcifications noted within the carotid siphons. Skull: Scalp soft tissues demonstrate no acute abnormality. Post craniotomy changes from remote right occipital craniotomy again noted. Sinuses/Orbits: Globes oral soft tissues within normal limits. Paranasal sinuses are clear. No mastoid effusion. Other: None. ASPECTS [facility] Stroke Program Early CT Score) - Ganglionic level infarction (  caudate, lentiform nuclei, internal capsule, insula, M1-M3 cortex): 7 - Supraganglionic infarction (M4-M6 cortex): 3 Total score (0-10 with 10 being normal): 10 IMPRESSION: 1. No acute intracranial infarct or other process identified. 2. ASPECTS is 10 3. Stable atrophy with advanced cerebral white matter hypoattenuation. 4. Sequelae of previous right occipital craniotomy with underlying right cerebellar encephalomalacia, stable. Critical Value/emergent results were called by telephone at the time of interpretation on 08/29/2017 at 10:20 pm to Dr. Nicole Kindred , who verbally acknowledged these results. Electronically Signed   By: Jeannine Boga M.D.   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 Acute stroke is less likely, but cannot be completely ruled out at this point.  Recommendations: 1. MRI of the brain without and with contrast rule out stroke as well as to rule out mass lesion 2. Continue Keppra at 500 mg IV or by mouth every 12 hours 3. EEG, routine adult study  We will continue to follow this patient with you.  C.R. Nicole Kindred, MD [facility] [phone]  08/29/2017, 10:30 PM

## 2017-08-29 NOTE — H&P (Signed)
[facility] Admission History and Physical Service Pager: [phone]  Patient name: Cynthia Massey Medical record number: [id number] Date of birth: [birth year] Age: 71 y.o. Gender: female  Primary Care Provider: Patient, No Pcp Per Consultants: Neurology Code Status: Full  Chief Complaint:  LOC and altered mental status   Assessment and Plan: Cynthia Massey is a 71 y.o. female presenting with LOC and slump to the right side. 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  LOC with AMS: She was eating dinner at the time she appeared to lose conciousness and slumped out of her chair to the right side. Patient received resuscitation maneuvers (mouth to mouth) from husband until EMS arrival. Code stroke was called on arrival to the ED. Neurology was consulted and CT head  showed no acute intracranial infarct or other process identified. Stable atrophy with advanced cerebral white matter hypoattenuation with sequelae of previous right occipital craniotomy with underlying right cerebellar encephalomalacia, stable.  Neurology based on their exam, imaging findings and patient initial presentation felt that patient symptoms could also be secondary to complex partial seizure with postictal state as a possible cause for decrease mentation. Loading dose of Keppra 1000 mg was ordered in the ED. For the Inpatient team, patient was unable to participate during exam and appeared more confused. Husband report declining mental status for the past year and a half, with difficulty remembering kids and grand kids name. - Admit to med-surg, admitting physician attending Dr. McDiarmid  - Vitals per floor - Neuro checks q2 hours for 12 hours - Neurology consulted and following -Follow up on MRI Brain w/o contrast  - Telemetry, continuous pulse ox - EEG  -  ECHO, EKG, [place] of carotids  - Lipid panel, Hbg A1c - PT/OT consult - Stroke swallow screen - CXR pending  Recent fractured ribs due to falls: Patient has fallen twice in the past week and was seen in the ED in both instance. Currently patient is being treated for 2 broken ribs on the left requiring lidocaine patch and pain management. She has had difficulty with balance for the past year. Per husband patient has been more unsteady on her feet in the past year. He reports with declining functional status patient was scheduled to have home health 4 days a week prior to moving to [place] last week due to the [facility]. - Monitor and continue to give lidocaine patch - Resume po medication once patient passes swallow study  H/o of breast cancer with metastatic brain tumor to the R cerebellum  Patient has a history of breast cancer with bilateral mastectomy in 2001. Patient was found to have metastasis to cerebellum in 2004 and underwent whole brain radiation.  HLD: Stable. On Lipitor 20 mg at home. - Hold home Lipitor until swallow study  FEN/GI: NPO pending swallow study Prophylaxis: Lovenox  Disposition: admit to med-surg  History of Present Illness:  Cynthia Massey is a 71 y.o. female presenting with LOC and slump to the right side. 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  She was eating dinner at the time she appeared to lose conciousness and slumped out of her chair to the right side. Her daughter caught her and eased her to the floor. She was noted be unconscious and herhusband thought she  wasn't breathing and agve mouth to mouth until she began to be responsive again. EMS then arrived. EMS described tonic deviation of eyes to the right as well as right facial droop. She was at her baseline before this event tonight. No loss of bladder or bowel during  event. She has no history of seizures. She has had a problem with memory difficulty since 2004. There are moments when she can't speak clearly and can't remember things, but this is more of a chronic problem.    On arrival to the ED, the patient began improving and eventually was able to speak and follow commands again. Patient was evaluated by the neurology team who felt symtpoms likely represented complex partial seizure.  Recommended no tPA administration. Patient was given a loading dose of Keppra and MRI brain with and without contrast was ordered.   They are evacuees from [place], and she has fallen twice she has been here in [place] and has 2 broken ribs on the left requiring lidocaine patch and pain management. She has had difficulty with balance for the past year. Husband reports she was dizzy today, and normally is not.    Review Of Systems: Per HPI with the following additions:   ROS  There are no active problems to display for this patient.   Past Medical History: Past Medical History:  Diagnosis Date  . Breast cancer metastasized to brain ([place])   . Dementia   . Hyperlipidemia     Past Surgical History: Past Surgical History:  Procedure Laterality Date  . CHOLECYSTECTOMY    . MASTECTOMY      Social History: Social History  Substance Use Topics  . Smoking status: Never Smoker  . Smokeless tobacco: Never Used  . Alcohol use No   Additional social history:   Please also refer to relevant sections of EMR.  Family History: No family history on file. Mother with history of cancer.   Allergies and Medications: Allergies  Allergen Reactions  . Morphine And Related Nausea And Vomiting   No current facility-administered medications on file prior to encounter.    Current Outpatient Prescriptions on File Prior to Encounter  Medication Sig Dispense Refill  . acetaminophen (TYLENOL 8 HOUR) 650 MG CR tablet Take 1 tablet (650 mg total) by mouth every 8 (eight)  hours as needed for pain. 30 tablet 0  . methocarbamol (ROBAXIN) 500 MG tablet Take 1 tablet (500 mg total) by mouth 2 (two) times daily. 20 tablet 0  . oxyCODONE-acetaminophen (PERCOCET/ROXICET) 5-325 MG tablet Take 2 tablets by mouth every 4 (four) hours as needed for severe pain. 15 tablet 0  . docusate sodium (COLACE) 100 MG capsule Take 1 capsule (100 mg total) by mouth every 12 (twelve) hours. 60 capsule 0  . lidocaine (LIDODERM) 5 % Place 1 patch onto the skin daily. Remove & Discard patch within 12 hours or as directed by MD 30 patch 0    Objective: BP (!) 133/54   Pulse 85   Resp 19   Ht 5\' 7"  (1.702 m)   Wt 154 lb 12.2 oz (70.2 kg)   SpO2 97%   BMI 24.24 kg/m  Exam: General: NAD, somnolent, confused Eyes: PERRL, no conjunctival pallor or injection ENTM: Moist mucous membranes Neck: Supple, no LAD Cardiovascular: RRR, 2/6 diastolic murmur noted at aortic post, no LE edema, 2+ pedal pulses Respiratory: CTA BL, normal work of breathing Gastrointestinal: soft, nondistended, normoactive BS MSK: tenderness over previously broken 2 ribs on L side Derm:  no rashes appreciated Neuro: unable to participate in full exam, weakness noted on R upper and lower extremity, but possibly due to inability to participate  Psych: not alert or oriented, confabulation of speech, slurred speech  Labs and Imaging: CBC BMET   Recent Labs Lab 08/29/17 2151 08/29/17 2156  WBC 9.1  --   HGB 12.0 12.2  HCT 37.7 36.0  PLT 266  --     Recent Labs Lab 08/29/17 2151 08/29/17 2156  NA 136 139  K 3.4* 3.6  CL 106 103  CO2 25  --   BUN 18 20  CREATININE 0.78 0.70  GLUCOSE 129* 126*  CALCIUM 8.8*  --      Ct Head Code Stroke Wo Contrast  Result Date: 08/29/2017 CLINICAL DATA:  Code stroke. Initial evaluation for acute right-sided facial droop. EXAM: CT HEAD WITHOUT CONTRAST TECHNIQUE: Contiguous axial images were obtained from the base of the skull through the vertex without intravenous  contrast. COMPARISON:  Prior CT from 08/26/2017. FINDINGS: Brain: Stable atrophy with advanced cerebral white matter changes. Encephalomalacia out within the right cerebellar hemisphere, unchanged. No acute intracranial hemorrhage. No evidence for acute large vessel territory infarct. No mass lesion, midline shift or mass effect. Ventricular prominence related underlying atrophy without hydrocephalus. No extra-axial fluid collection. Vascular: No hyperdense vessel. Scattered vascular calcifications noted within the carotid siphons. Skull: Scalp soft tissues demonstrate no acute abnormality. Post craniotomy changes from remote right occipital craniotomy again noted. Sinuses/Orbits: Globes oral soft tissues within normal limits. Paranasal sinuses are clear. No mastoid effusion. Other: None. ASPECTS [facility] Stroke Program Early CT Score) - Ganglionic level infarction (caudate, lentiform nuclei, internal capsule, insula, M1-M3 cortex): 7 - Supraganglionic infarction (M4-M6 cortex): 3 Total score (0-10 with 10 being normal): 10 IMPRESSION: 1. No acute intracranial infarct or other process identified. 2. ASPECTS is 10 3. Stable atrophy with advanced cerebral white matter hypoattenuation. 4. Sequelae of previous right occipital craniotomy with underlying right cerebellar encephalomalacia, stable. Critical Value/emergent results were called by telephone at the time of interpretation on 08/29/2017 at 10:20 pm to Dr. Nicole Kindred , who verbally acknowledged these results. Electronically Signed   By: Jeannine Boga M.D.   On: 08/29/2017 22:24    Shirley, [place], DO 08/29/2017, 11:34 PM PGY-1, [facility] pager: [phone], text pages welcome  I have seen and evaluated the patient with Dr. Enid Derry. I am in agreement with the note above in its revised form. My additions are in blue.  Marjie Skiff, MD Family Medicine, PGY-2

## 2017-08-30 ENCOUNTER — Inpatient Hospital Stay (HOSPITAL_COMMUNITY): Payer: Medicare Other

## 2017-08-30 ENCOUNTER — Emergency Department (HOSPITAL_COMMUNITY): Payer: Medicare Other

## 2017-08-30 ENCOUNTER — Inpatient Hospital Stay (HOSPITAL_COMMUNITY): Payer: No Typology Code available for payment source

## 2017-08-30 ENCOUNTER — Encounter (HOSPITAL_COMMUNITY): Payer: Self-pay | Admitting: General Practice

## 2017-08-30 DIAGNOSIS — R55 Syncope and collapse: Secondary | ICD-10-CM | POA: Diagnosis present

## 2017-08-30 DIAGNOSIS — B9620 Unspecified Escherichia coli [E. coli] as the cause of diseases classified elsewhere: Secondary | ICD-10-CM | POA: Diagnosis present

## 2017-08-30 DIAGNOSIS — R29810 Facial weakness: Secondary | ICD-10-CM | POA: Diagnosis present

## 2017-08-30 DIAGNOSIS — Y92231 Patient bathroom in hospital as the place of occurrence of the external cause: Secondary | ICD-10-CM | POA: Diagnosis not present

## 2017-08-30 DIAGNOSIS — G9389 Other specified disorders of brain: Secondary | ICD-10-CM | POA: Diagnosis present

## 2017-08-30 DIAGNOSIS — M8008XA Age-related osteoporosis with current pathological fracture, vertebra(e), initial encounter for fracture: Secondary | ICD-10-CM | POA: Diagnosis present

## 2017-08-30 DIAGNOSIS — R2689 Other abnormalities of gait and mobility: Secondary | ICD-10-CM | POA: Diagnosis not present

## 2017-08-30 DIAGNOSIS — Z853 Personal history of malignant neoplasm of breast: Secondary | ICD-10-CM

## 2017-08-30 DIAGNOSIS — Z923 Personal history of irradiation: Secondary | ICD-10-CM | POA: Diagnosis not present

## 2017-08-30 DIAGNOSIS — F0390 Unspecified dementia without behavioral disturbance: Secondary | ICD-10-CM | POA: Diagnosis present

## 2017-08-30 DIAGNOSIS — R32 Unspecified urinary incontinence: Secondary | ICD-10-CM | POA: Diagnosis not present

## 2017-08-30 DIAGNOSIS — Z885 Allergy status to narcotic agent status: Secondary | ICD-10-CM | POA: Diagnosis not present

## 2017-08-30 DIAGNOSIS — M8088XS Other osteoporosis with current pathological fracture, vertebra(e), sequela: Secondary | ICD-10-CM | POA: Diagnosis not present

## 2017-08-30 DIAGNOSIS — R569 Unspecified convulsions: Secondary | ICD-10-CM | POA: Diagnosis not present

## 2017-08-30 DIAGNOSIS — E785 Hyperlipidemia, unspecified: Secondary | ICD-10-CM | POA: Diagnosis present

## 2017-08-30 DIAGNOSIS — R4182 Altered mental status, unspecified: Secondary | ICD-10-CM | POA: Diagnosis present

## 2017-08-30 DIAGNOSIS — I69320 Aphasia following cerebral infarction: Secondary | ICD-10-CM | POA: Diagnosis not present

## 2017-08-30 DIAGNOSIS — R296 Repeated falls: Secondary | ICD-10-CM

## 2017-08-30 DIAGNOSIS — G40209 Localization-related (focal) (partial) symptomatic epilepsy and epileptic syndromes with complex partial seizures, not intractable, without status epilepticus: Secondary | ICD-10-CM | POA: Diagnosis present

## 2017-08-30 DIAGNOSIS — C7931 Secondary malignant neoplasm of brain: Secondary | ICD-10-CM | POA: Diagnosis present

## 2017-08-30 DIAGNOSIS — M810 Age-related osteoporosis without current pathological fracture: Secondary | ICD-10-CM

## 2017-08-30 DIAGNOSIS — S2232XA Fracture of one rib, left side, initial encounter for closed fracture: Secondary | ICD-10-CM

## 2017-08-30 DIAGNOSIS — I639 Cerebral infarction, unspecified: Secondary | ICD-10-CM | POA: Insufficient documentation

## 2017-08-30 DIAGNOSIS — R269 Unspecified abnormalities of gait and mobility: Secondary | ICD-10-CM | POA: Diagnosis present

## 2017-08-30 DIAGNOSIS — Z9013 Acquired absence of bilateral breasts and nipples: Secondary | ICD-10-CM | POA: Diagnosis not present

## 2017-08-30 DIAGNOSIS — G9340 Encephalopathy, unspecified: Secondary | ICD-10-CM | POA: Diagnosis not present

## 2017-08-30 DIAGNOSIS — S2242XA Multiple fractures of ribs, left side, initial encounter for closed fracture: Secondary | ICD-10-CM

## 2017-08-30 DIAGNOSIS — E876 Hypokalemia: Secondary | ICD-10-CM | POA: Diagnosis not present

## 2017-08-30 DIAGNOSIS — I6789 Other cerebrovascular disease: Secondary | ICD-10-CM | POA: Diagnosis not present

## 2017-08-30 DIAGNOSIS — R29704 NIHSS score 4: Secondary | ICD-10-CM | POA: Diagnosis present

## 2017-08-30 DIAGNOSIS — I7389 Other specified peripheral vascular diseases: Secondary | ICD-10-CM | POA: Diagnosis present

## 2017-08-30 DIAGNOSIS — H518 Other specified disorders of binocular movement: Secondary | ICD-10-CM | POA: Diagnosis present

## 2017-08-30 DIAGNOSIS — R159 Full incontinence of feces: Secondary | ICD-10-CM | POA: Diagnosis not present

## 2017-08-30 DIAGNOSIS — W1830XA Fall on same level, unspecified, initial encounter: Secondary | ICD-10-CM | POA: Diagnosis not present

## 2017-08-30 DIAGNOSIS — N390 Urinary tract infection, site not specified: Secondary | ICD-10-CM | POA: Diagnosis present

## 2017-08-30 HISTORY — DX: Cerebral infarction, unspecified: I63.9

## 2017-08-30 HISTORY — DX: Repeated falls: R29.6

## 2017-08-30 HISTORY — DX: Age-related osteoporosis with current pathological fracture, vertebra(e), initial encounter for fracture: M80.08XA

## 2017-08-30 HISTORY — DX: Other abnormalities of gait and mobility: R26.89

## 2017-08-30 HISTORY — DX: Age-related osteoporosis without current pathological fracture: M81.0

## 2017-08-30 HISTORY — DX: Fracture of one rib, left side, initial encounter for closed fracture: S22.32XA

## 2017-08-30 HISTORY — DX: Other specified disorders of brain: G93.89

## 2017-08-30 HISTORY — DX: Personal history of malignant neoplasm of breast: Z85.3

## 2017-08-30 LAB — URINALYSIS, ROUTINE W REFLEX MICROSCOPIC
BILIRUBIN URINE: NEGATIVE
Glucose, UA: NEGATIVE mg/dL
Hgb urine dipstick: NEGATIVE
KETONES UR: NEGATIVE mg/dL
NITRITE: POSITIVE — AB
Protein, ur: 30 mg/dL — AB
SPECIFIC GRAVITY, URINE: 1.026 (ref 1.005–1.030)
Squamous Epithelial / LPF: NONE SEEN
pH: 6 (ref 5.0–8.0)

## 2017-08-30 LAB — LIPID PANEL
CHOL/HDL RATIO: 2.8 ratio
Cholesterol: 140 mg/dL (ref 0–200)
HDL: 50 mg/dL (ref >40)
LDL Cholesterol: 70 mg/dL (ref 0–99)
Triglycerides: 101 mg/dL (ref <150)
VLDL: 20 mg/dL (ref 0–40)

## 2017-08-30 LAB — HEMOGLOBIN A1C
Hgb A1c MFr Bld: 5.3 % (ref 4.8–5.6)
MEAN PLASMA GLUCOSE: 105.41 mg/dL

## 2017-08-30 LAB — TSH: TSH: 0.354 u[IU]/mL (ref 0.350–4.500)

## 2017-08-30 LAB — VITAMIN B12: Vitamin B-12: 318 pg/mL (ref 180–914)

## 2017-08-30 MED ORDER — ACETAMINOPHEN 500 MG PO TABS
500.0000 mg | ORAL_TABLET | Freq: Four times a day (QID) | ORAL | Status: DC | PRN
  Administered 2017-08-30 – 2017-09-01 (×3): 500 mg via ORAL
  Filled 2017-08-30 (×4): qty 1

## 2017-08-30 MED ORDER — SODIUM CHLORIDE 0.9 % IV SOLN
500.0000 mg | Freq: Two times a day (BID) | INTRAVENOUS | Status: DC
  Administered 2017-08-30 (×2): 500 mg via INTRAVENOUS
  Filled 2017-08-30 (×3): qty 5

## 2017-08-30 MED ORDER — SODIUM CHLORIDE 0.9 % IV SOLN
INTRAVENOUS | Status: DC
  Administered 2017-08-30: 09:00:00 via INTRAVENOUS

## 2017-08-30 MED ORDER — ENOXAPARIN SODIUM 40 MG/0.4ML ~~LOC~~ SOLN
40.0000 mg | SUBCUTANEOUS | Status: DC
  Administered 2017-08-30 – 2017-09-03 (×5): 40 mg via SUBCUTANEOUS
  Filled 2017-08-30 (×6): qty 0.4

## 2017-08-30 MED ORDER — LORAZEPAM 2 MG/ML IJ SOLN
INTRAMUSCULAR | Status: AC
  Filled 2017-08-30: qty 1

## 2017-08-30 MED ORDER — STROKE: EARLY STAGES OF RECOVERY BOOK
Freq: Once | Status: DC
  Filled 2017-08-30: qty 1

## 2017-08-30 MED ORDER — LORAZEPAM 2 MG/ML IJ SOLN
1.0000 mg | Freq: Once | INTRAMUSCULAR | Status: AC
  Administered 2017-08-30: 1 mg via INTRAVENOUS

## 2017-08-30 MED ORDER — LIDOCAINE 5 % EX PTCH
1.0000 | MEDICATED_PATCH | CUTANEOUS | Status: DC
  Administered 2017-08-30 – 2017-09-03 (×5): 1 via TRANSDERMAL
  Filled 2017-08-30 (×6): qty 1

## 2017-08-30 NOTE — ED Notes (Signed)
Pt changed and repositioned in bed.

## 2017-08-30 NOTE — ED Notes (Signed)
Pt returns from MRI. Pt was medicaid and now she isnt following commands. When you call her name she respond. Pupils are same size and reactive like prior to MRI. Pt will move her extremities if you uncover her. Dr. Nicole Kindred Neurohospitalist was made aware of the situation. No new orders

## 2017-08-30 NOTE — ED Notes (Signed)
Patient transported to MRI 

## 2017-08-30 NOTE — Progress Notes (Signed)
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 MRI of the brain was done that does not reveal an acute stroke. She continues to be altered for her husband was at bedside. She has had similar episodes in the past and has been evaluated for TIAs. She also has had 30 day cardiac monitoring that did not reveal any abnormality that could explain her passing out spells or spells of "aphasia" in the past.   O:// Current vital signs: BP (!) 154/70   Pulse 95   Temp 98.4 F (36.9 C)   Resp 17   Ht 5\' 7"  (1.702 m)   Wt 70.2 kg (154 lb 12.2 oz)   SpO2 96%   BMI 24.24 kg/m  Vital signs in last 24 hours: Temp:  [98.4 F (36.9 C)] 98.4 F (36.9 C) (09/21 0430) Pulse Rate:  [80-95] 95 (09/21 0800) Resp:  [17-19] 17 (09/21 0800) BP: (121-154)/(54-103) 154/70 (09/21 0800) SpO2:  [95 %-97 %] 96 % (09/21 0800) Weight:  [70.2 kg (154 lb 12.2 oz)] 70.2 kg (154 lb 12.2 oz) (09/20 2209) Gen. exam: Well-developed, well-nourished in no apparent distress. HEENT: Normocephalic, eyes normal conjunctivae normal, nares clear, auditory canals clear. Neck supple. CVS: Regular rate rhythm S1-S2 heard Respiratory: Chest clear Abdomen: Soft nondistended next line extremities: No joint deformities or effusions or inflammatory changes Neurological exam Mental status: Patient is sleepy, awakens to voice, follows some simple commands intermittently. Significant word finding difficulty and poor attention and concentration. Cranial nerves: Pupils equal round reactive to light, unable to assess visual fields to threat consistently, face grossly symmetric. Motor exam: Antigravity in all fours and did not cooperate with  formal testing. Coordination: Unable to test coordination Sensory: Symmetric withdrawal to noxious stimulus Gait cannot be tested.  Medications  Current Facility-Administered Medications:  .   stroke: mapping our early stages of recovery book, , Does not apply, Once, Marjie Skiff, MD, Stopped at 08/30/17 0828 .  0.9 %  sodium chloride infusion, , Intravenous, Continuous, Diallo, Abdoulaye, MD .  enoxaparin (LOVENOX) injection 40 mg, 40 mg, Subcutaneous, Q24H, Diallo, Abdoulaye, MD .  levETIRAcetam (KEPPRA) 500 mg in sodium chloride 0.9 % 100 mL IVPB, 500 mg, Intravenous, Q12H, Rory Percy, Ronnisha Felber, MD .  lidocaine (LIDODERM) 5 % 1 patch, 1 patch, Transdermal, Q24H, Diallo, Abdoulaye, MD  Current Outpatient Prescriptions:  .  acetaminophen (TYLENOL 8 HOUR) 650 MG CR tablet, Take 1 tablet (650 mg total) by mouth every 8 (eight) hours as needed for pain., Disp: 30 tablet, Rfl: 0 .  aspirin EC 81 MG tablet, Take 81 mg by mouth daily., Disp: , Rfl:  .  atorvastatin (LIPITOR) 20 MG tablet, Take 20 mg by mouth daily., Disp: , Rfl:  .  HYDROcodone-acetaminophen (NORCO/VICODIN) 5-325 MG tablet, Take 1 tablet by mouth every 12 (twelve) hours as needed for moderate pain., Disp: , Rfl:  .  methocarbamol (ROBAXIN) 500 MG tablet, Take 1 tablet (500 mg total) by mouth 2 (two) times daily., Disp: 20 tablet, Rfl: 0 .  oxyCODONE-acetaminophen (PERCOCET/ROXICET) 5-325 MG tablet, Take 2 tablets by mouth every 4 (four) hours as needed for severe pain., Disp: 15 tablet, Rfl: 0 .  docusate sodium (COLACE) 100 MG capsule, Take 1 capsule (100 mg total) by mouth  every 12 (twelve) hours., Disp: 60 capsule, Rfl: 0 .  lidocaine (LIDODERM) 5 %, Place 1 patch onto the skin daily. Remove & Discard patch within 12 hours or as directed by MD, Disp: 30 patch, Rfl: 0 Labs CBC    Component Value Date/Time   WBC 9.1 08/29/2017 2151   RBC 4.34 08/29/2017 2151   HGB 12.2 08/29/2017 2156   HCT 36.0 08/29/2017 2156   PLT 266  08/29/2017 2151   MCV 86.9 08/29/2017 2151   MCH 27.6 08/29/2017 2151   MCHC 31.8 08/29/2017 2151   RDW 14.5 08/29/2017 2151   LYMPHSABS 2.4 08/29/2017 2151   MONOABS 0.6 08/29/2017 2151   EOSABS 0.2 08/29/2017 2151   BASOSABS 0.0 08/29/2017 2151    CMP     Component Value Date/Time   NA 139 08/29/2017 2156   K 3.6 08/29/2017 2156   CL 103 08/29/2017 2156   CO2 25 08/29/2017 2151   GLUCOSE 126 (H) 08/29/2017 2156   BUN 20 08/29/2017 2156   CREATININE 0.70 08/29/2017 2156   CALCIUM 8.8 (L) 08/29/2017 2151   PROT 6.3 (L) 08/29/2017 2151   ALBUMIN 3.6 08/29/2017 2151   AST 46 (H) 08/29/2017 2151   ALT 51 08/29/2017 2151   ALKPHOS 100 08/29/2017 2151   BILITOT 0.6 08/29/2017 2151   GFRNONAA >60 08/29/2017 2151   GFRAA >60 08/29/2017 2151    Lipid Panel     Component Value Date/Time   CHOL 140 08/30/2017 0246   TRIG 101 08/30/2017 0246   HDL 50 08/30/2017 0246   CHOLHDL 2.8 08/30/2017 0246   VLDL 20 08/30/2017 0246   LDLCALC 70 08/30/2017 0246     Imaging I have reviewed images in epic and the results pertinent to this consultation are: CT-scan of the brain - no evidence of acute evolving stroke or hemorrhage. Postsurgical changes in the right posterior fossa with encephalomalacia in the right cerebellum. Extensive white matter disease MRI examination of the brain - motion little. No evidence of acute stroke. 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 Differentials include complex partial seizure and acute stroke. Brain MRI was done and acute stroke was not found on the brain MRI. Other differentials to  consider toxic metabolic encephalopathy, in addition to the compact partial seizure. She would also be in an extended postictal phase because of pre-existing dementia and extensive there are degenerative changes seen on her MRI.   Recommendations: Loaded with Keppra 1 g IV on presentation. Continue with Keppra 500 mg twice a day. EEG being done this morning. Will await results. If she is stable enough, would recommend getting an MRI of brain with and without contrast as she does have a history of metastatic breast cancer that affected the brain and it would be prudent to look for further spread. Seizure precautions.  We'll continue to follow with you.  -- Amie Portland, MD [facility] [phone]  If 7pm to 7am, please call on call as listed on AMION.

## 2017-08-30 NOTE — ED Notes (Signed)
Pillow placed on patient, left side, for wound relief.

## 2017-08-30 NOTE — ED Notes (Signed)
EEG in process

## 2017-08-30 NOTE — Progress Notes (Signed)
EEG complete, results pending 

## 2017-08-30 NOTE — Progress Notes (Signed)
Family Medicine Teaching Service Daily Progress Note Intern Pager: [phone]  Patient name: Cynthia Massey Medical record number: [id number] Date of birth: 05/04/1946 Age: 71 y.o. Gender: female  Primary Care Provider: System, Pcp Not In Consultants: Neurology Code Status: Full  Pt Overview and Major Events to Date:  9/21: Admitted for LOC and AMS  Assessment and Plan: Cynthia Massey is a 71 y.o. female presenting with LOC and slump to the right side. 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  LOC with AMS: Code stroke called on arrival to the ED. Neurology was consulted and CT head showed no acute intracranial infarct or other process identified. Stable atrophy with advanced cerebral white matter hypoattenuation with sequelae of previous right occipital craniotomy with underlying right cerebellar encephalomalacia, stable.  Neurology, based on their exam, imaging findings and patient initial presentation, felt that patient symptoms could also be secondary to complex partial seizure with postictal state as a possible cause for decrease mentation. Loading dose of Keppra 1000 mg was ordered in the ED. Husband reported declining mental status for the past year and a half. Postictal state vs  - Vitals per floor - Neuro checks q2 hours for 12 hours - Neurology recs appreciated - Follow up on MRI Brain w/ and w/o contrast if patient tolerates - Telemetry, continuous pulse ox - EEG- diffuse cerebral dysfunction that is non-specific in etiology and can be seen with hypoxic/ischemic injury, toxic/metabolic encephalopathies, neurodegenerative disorders, or medication effect.  The absence of epileptiform discharges does not rule out a clinical diagnosis of epilepsy.  Clinical correlation is advised. - ECHO, [place] of carotids  - UA pending - Lipid panel tot chol 140, TG  101, HDL 50, LDL 70 - Hbg A1c 5.3 - PT/OT consult - Stroke swallow screen pending - CXR: Low volumes, borderline cardiomegaly. No infiltrate or edema - Continue with Keppra 500 mg twice a day - Patient not started on ASA due to probable seizure etiology from neurology.   Recent fractured ribs due to falls: Patient has fallen twice in the past week and was seen in the ED in both instances. Currently patient is being treated for 2 broken ribs on the left requiring lidocaine patch and pain management. She has had difficulty with balance for the past year. Per husband patient has been more unsteady on her feet in the past year. He reports with declining functional status patient was scheduled to have home health 4 days a week prior to moving to [place] last week due to the [facility]. - Monitor and continue to give lidocaine patch - Resume po medication once patient passes swallow study- scheduled tylenol  H/o of breast cancer with metastatic brain tumor to the R cerebellum  Patient has a history of breast cancer with bilateral mastectomy in 2001. Patient was found to have metastasis to cerebellum in 2004 and underwent whole brain radiation.  HLD: Stable. On Lipitor 20 mg at home. - Hold home Lipitor until swallow study  FEN/GI: NPO pending swallow study Prophylaxis: Lovenox  Disposition: admit to med-surg  Subjective:  Patient is still very confused this morning. Limited participation int he exam. Having EEG performed.   Objective: Temp:  [98.4 F (36.9 C)] 98.4 F (36.9 C) (09/21 0430) Pulse Rate:  [80-95] 92 (09/21 0900) Resp:  [10-21] 21 (09/21 0900) BP: (121-155)/(54-103) 155/74 (09/21 0900) SpO2:  [95 %-97 %] 95 % (09/21 0900) Weight:  [  154 lb 12.2 oz (70.2 kg)] 154 lb 12.2 oz (70.2 kg) (09/20 2209) Physical Exam: General: NAD, somnolent, confused Cardiovascular: RRR, 2/6 diastolic murmur noted at aortic post Respiratory: CTA BL, normal work of breathing Gastrointestinal:  soft, nondistended, normoactive BS Extremities: no LE edema noted  Laboratory:  Recent Labs Lab 08/26/17 1410 08/28/17 0942 08/29/17 2151 08/29/17 2156  WBC 8.9 6.7 9.1  --   HGB 13.2 12.0 12.0 12.2  HCT 40.2 36.4 37.7 36.0  PLT 296 269 266  --     Recent Labs Lab 08/26/17 1410 08/28/17 0942 08/29/17 2151 08/29/17 2156  NA 141 138 136 139  K 3.5 4.0 3.4* 3.6  CL 108 107 106 103  CO2 26 26 25   --   BUN 16 18 18 20   CREATININE 0.71 0.74 0.78 0.70  CALCIUM 8.9 9.2 8.8*  --   PROT 7.2 6.8 6.3*  --   BILITOT 0.8 0.6 0.6  --   ALKPHOS 73 75 100  --   ALT 19 18 51  --   AST 22 21 46*  --   GLUCOSE 95 110* 129* 126*   tot chol 140, TG 101, HDL 50, LDL 70 Hbg A1c 5.3  Imaging/Diagnostic Tests: Dg Chest 2 View  Result Date: 08/30/2017 CLINICAL DATA:  Chest pain EXAM: CHEST  2 VIEW COMPARISON:  08/28/2017, 08/26/2017 FINDINGS: Low lung volumes. No acute consolidation or pleural effusion. Borderline cardiomegaly, augmented by low lung volume. No pneumothorax. Surgical clips in the left upper quadrant and right chest. Compression deformity at T12. IMPRESSION: Low lung volumes with borderline cardiomegaly. No infiltrate or edema Electronically Signed   By: Donavan Foil M.D.   On: 08/30/2017 02:30   Mr Brain Wo Contrast  Result Date: 08/30/2017 CLINICAL DATA:  Loss of consciousness.  Seizure. EXAM: MRI HEAD WITHOUT CONTRAST TECHNIQUE: Multiplanar, multiecho pulse sequences of the brain and surrounding structures were obtained without intravenous contrast. COMPARISON:  Head CT 08/29/2017 FINDINGS: The study is degraded by motion, despite efforts to reduce this artifact, including utilization of motion-resistant MR sequences. The findings of the study are interpreted in the context of reduced sensitivity/specificity. Brain: The midline structures are normal. There is no focal diffusion restriction to indicate acute infarct. Old right cerebellar infarct. There is diffuse confluent  hyperintense T2-weighted signal within the periventricular, deep and juxtacortical white matter, most often seen in the setting of chronic microvascular ischemia. No intraparenchymal hematoma or chronic microhemorrhage. There is diffuse volume loss. The dura is normal and there is no extra-axial collection. Vascular: Major intracranial arterial and venous sinus flow voids are preserved. Skull and upper cervical spine: The visualized skull base, calvarium, upper cervical spine and extracranial soft tissues are normal. Sinuses/Orbits: No fluid levels or advanced mucosal thickening. No mastoid or middle ear effusion. Normal orbits. IMPRESSION: 1. Motion degraded examination. Within that limitation, no visualized acute abnormality. 2. Advanced volume loss and chronic ischemic microangiopathy. Electronically Signed   By: Ulyses Jarred M.D.   On: 08/30/2017 02:09   Ct Head Code Stroke Wo Contrast  Result Date: 08/29/2017 CLINICAL DATA:  Code stroke. Initial evaluation for acute right-sided facial droop. EXAM: CT HEAD WITHOUT CONTRAST TECHNIQUE: Contiguous axial images were obtained from the base of the skull through the vertex without intravenous contrast. COMPARISON:  Prior CT from 08/26/2017. FINDINGS: Brain: Stable atrophy with advanced cerebral white matter changes. Encephalomalacia out within the right cerebellar hemisphere, unchanged. No acute intracranial hemorrhage. No evidence for acute large vessel territory infarct. No mass  lesion, midline shift or mass effect. Ventricular prominence related underlying atrophy without hydrocephalus. No extra-axial fluid collection. Vascular: No hyperdense vessel. Scattered vascular calcifications noted within the carotid siphons. Skull: Scalp soft tissues demonstrate no acute abnormality. Post craniotomy changes from remote right occipital craniotomy again noted. Sinuses/Orbits: Globes oral soft tissues within normal limits. Paranasal sinuses are clear. No mastoid effusion.  Other: None. ASPECTS [facility] Stroke Program Early CT Score) - Ganglionic level infarction (caudate, lentiform nuclei, internal capsule, insula, M1-M3 cortex): 7 - Supraganglionic infarction (M4-M6 cortex): 3 Total score (0-10 with 10 being normal): 10 IMPRESSION: 1. No acute intracranial infarct or other process identified. 2. ASPECTS is 10 3. Stable atrophy with advanced cerebral white matter hypoattenuation. 4. Sequelae of previous right occipital craniotomy with underlying right cerebellar encephalomalacia, stable. Critical Value/emergent results were called by telephone at the time of interpretation on 08/29/2017 at 10:20 pm to Dr. Nicole Kindred , who verbally acknowledged these results. Electronically Signed   By: Jeannine Boga M.D.   On: 08/29/2017 22:24    Ferrell Claiborne, [place], DO 08/30/2017, 9:40 AM PGY-1, [facility] Intern pager: [phone], text pages welcome

## 2017-08-30 NOTE — Procedures (Addendum)
ELECTROENCEPHALOGRAM REPORT  Date of Study: 08/30/2017  Patient's Name: Cynthia Massey MRN: [id number] Date of Birth: Oct 07, 1946  Referring Provider: Marjie Skiff, MD  Clinical History: 71 year old female with metastatic breast cancer presents with sudden onset of gaze deviation and altered mentation witnessed by family  Medications: Keppra  Technical Summary: A multichannel digital EEG recording measured by the international 10-20 system with electrodes applied with paste and impedances below 5000 ohms performed as portable with EKG monitoring in an awake and drowsy patient.  Hyperventilation was not performed.  Photic stimulation was performed.  The digital EEG was referentially recorded, reformatted, and digitally filtered in a variety of bipolar and referential montages for optimal display.   Description: The patient is awake and drowsy during the recording.  During maximal wakefulness, there is a symmetric, medium voltage 5-6 Hz posterior dominant rhythm that attenuates with eye opening. This is admixed with diffuse 4-5 Hz theta and 2-3 Hz delta slowing of the waking background.  During drowsiness, there is an increase in theta slowing of the background.  Stage 2 sleep was not seen.  Photic stimulation did not elicit any abnormalities.  There were no epileptiform discharges or electrographic seizures seen.    EKG lead was unremarkable.  Impression: This awake and drowsy EEG is abnormal due to diffuse slowing of the waking background.  Clinical Correlation of the above findings indicates diffuse cerebral dysfunction that is non-specific in etiology and can be seen with hypoxic/ischemic injury, toxic/metabolic encephalopathies, neurodegenerative disorders, or medication effect.  The absence of epileptiform discharges does not rule out a clinical diagnosis of epilepsy.  Clinical correlation is advised.   Metta Clines, DO

## 2017-08-30 NOTE — Discharge Summary (Signed)
[facility] Discharge Summary  Patient name: Cynthia Massey Medical record number: [id number] Date of birth: 25-Oct-1946 Age: 71 y.o. 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 female who presented with LOC and slump to the right side. 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  Patient came into the ED after she appeared to lose consciousness eating dinner and slumped out of her chair to the right side. Code stroke was called on arrival to the ED. Neurology was consulted and CT head showed no acute intracranial infarct or other process identified. Stable atrophy with advanced cerebral white matter hypoattenuation with sequelae of previous right occipital craniotomy with underlying right cerebellar encephalomalacia, stable. Neurology based on their exam, imaging findings and patient initial presentation felt that patient symptoms could also be secondary to complex partial seizure with postictal state as a possible cause for decrease mentation. Loading dose of Keppra 1000 mg was ordered and patient was increased to a dose of 1000 mg BID. Husband report declining mental status for the past year and a half. While admitted patient returned to baseline, but was found to have a UTI with urine culture growing E. Coli, and although she had no symptoms, due to her altered  mental status she was treated. She was started on Keflex for a 7 days treatment that would end on 9/29. While working with PT she had another moment where she appeared to have facial droop and weakness. Code stroke was again called and CT head without again showed jo acute abnormality. Repeat EEG showed no new findings. MRI w/ and w/o were able to be obtained showing no signs of acute CVA or tumors. She was believed to again had a partial seizure. She is being discharged at her baseline level of mentation.   Issues for Follow Up:  1. Neurology recommends close outpatient follow-up with patient's neurologist in [place]. Also recommended continuing patient on Keppra 1000 mg bid. 2. PT, OT, SLP recommending SNF. 3. E. Coli in urine susceptible to Keflex qid, continue for 7 day, last day of treatment on 09/29 4. Pain control for recent rib fractures including tylenol and lidocaine patch.   Significant Procedures:   Significant Labs and Imaging:   Recent Labs Lab 08/28/17 0942 08/29/17 2151 08/29/17 2156  WBC 6.7 9.1  --   HGB 12.0 12.0 12.2  HCT 36.4 37.7 36.0  PLT 269 266  --     Recent Labs Lab 08/28/17 0942 08/29/17 2151 08/29/17 2156 08/31/17 1241 09/01/17 0652 09/02/17 1127  NA 138 136 139 137 139 139  K 4.0 3.4* 3.6 4.2 3.1* 3.3*  CL 107 106 103 108 108 107  CO2 26 25  --  22 25 24   GLUCOSE 110* 129* 126* 101* 95 111*  BUN 18 18 20 11 11 12   CREATININE 0.74 0.78 0.70 0.63 0.67 0.82  CALCIUM 9.2 8.8*  --  8.9 8.9 8.7*  ALKPHOS 75 100  --   --   --   --  AST 21 46*  --   --   --   --   ALT 18 51  --   --   --   --   ALBUMIN 3.6 3.6  --   --   --   --    EEG Impression: This awake and drowsyEEG is abnormal due to the presence of: 1. Moderatediffuse slowing of the waking background 2. Additional focal slowing over the right hemisphere  Clinical Correlation of the above findings indicates diffuse cerebral dysfunction that is non-specific in etiology and can be  seen with hypoxic/ischemic injury, toxic/metabolic encephalopathies, or medication effect. Focal slowing over the right hemisphere indicates focal cerebral dysfunction in this region suggestive of underlying structural or physiologic abnormality.The absence of epileptiform discharges does not rule out a clinical diagnosis of epilepsy. Clinical correlation is advised.  ECHO: Study Conclusions  - Left ventricle: The cavity size was normal. Wall thickness was   increased in a pattern of mild LVH. Systolic function was normal.   The estimated ejection fraction was in the range of 60% to 65%.   Wall motion was normal; there were no regional wall motion   abnormalities. Doppler parameters are consistent with abnormal   left ventricular relaxation (grade 1 diastolic dysfunction).  Ct Angio Head W Or Wo Contrast  Result Date: 09/02/2017 CLINICAL DATA:  Aphasia. EXAM: CT ANGIOGRAPHY HEAD AND NECK TECHNIQUE: Multidetector CT imaging of the head and neck was performed using the standard protocol during bolus administration of intravenous contrast. Multiplanar CT image reconstructions and MIPs were obtained to evaluate the vascular anatomy. Carotid stenosis measurements (when applicable) are obtained utilizing NASCET criteria, using the distal internal carotid diameter as the denominator. CONTRAST:  50 cc Isovue 370 intravenous COMPARISON:  Head CT from earlier today.  Brain MRI 08/30/2017 FINDINGS: CTA NECK FINDINGS Aortic arch: Atherosclerosis. No acute finding. Three vessel branching. Right carotid system: There is mild atheromatous wall thickening of the common carotid artery. Vessels are smooth and widely patent. Mild ICA tortuosity. Left carotid system: Vessels are smooth and widely patent. No notable atheromatous changes. Vertebral arteries: Proximal subclavian atherosclerosis without flow limiting stenosis. Mild atheromatous narrowing of the right V1 segment. No high-grade stenosis, ulceration, or  dissection. Skeleton: No acute or aggressive finding. Right retromastoid craniotomy, reportedly for brain metastasis resection. Other neck: No incidental mass or adenopathy. Upper chest: Subpleural reticulation in the right upper lobe, likely from radiotherapy. Partly seen changes of right axillary dissection. Review of the MIP images confirms the above findings CTA HEAD FINDINGS Anterior circulation: No branch occlusion or flow limiting stenosis. Mild bilateral M2 segment narrowings, most notable in the left MCA inferior division. Negative for aneurysm or beading. Posterior circulation: Essentially codominant vertebral arteries that are tortuous. Hypoplastic right P1 segment. Symmetric good opacification of bilateral posterior cerebral arteries. Unremarkable bilateral cerebellar arteries. Venous sinuses: Patent on the delayed phase. Anatomic variants: As above Delayed phase: No abnormal intracranial enhancement to suggest recurrent metastatic disease. There is encephalomalacia deep to the right retromastoid craniotomy, reported site of brain metastasis treatment. Confluent low-density in the cerebral white matter with central atrophy and ventriculomegaly, suspected sequela of whole-brain radiotherapy. Case reviewed in person with Dr. Leonel Ramsay. Review of the MIP images confirms the above findings IMPRESSION: 1. No emergent large vessel occlusion. 2. Overall mild atherosclerosis. No flow limiting stenosis in the head or neck. 3. History of brain metastases with surgery and radiation. No evidence of active metastatic disease on postcontrast scan. Electronically Signed   By: Angelica Chessman  Watts M.D.   On: 09/02/2017 11:37   Ct Angio Neck W Or Wo Contrast  Result Date: 09/02/2017 CLINICAL DATA:  Aphasia. EXAM: CT ANGIOGRAPHY HEAD AND NECK TECHNIQUE: Multidetector CT imaging of the head and neck was performed using the standard protocol during bolus administration of intravenous contrast. Multiplanar CT image  reconstructions and MIPs were obtained to evaluate the vascular anatomy. Carotid stenosis measurements (when applicable) are obtained utilizing NASCET criteria, using the distal internal carotid diameter as the denominator. CONTRAST:  50 cc Isovue 370 intravenous COMPARISON:  Head CT from earlier today.  Brain MRI 08/30/2017 FINDINGS: CTA NECK FINDINGS Aortic arch: Atherosclerosis. No acute finding. Three vessel branching. Right carotid system: There is mild atheromatous wall thickening of the common carotid artery. Vessels are smooth and widely patent. Mild ICA tortuosity. Left carotid system: Vessels are smooth and widely patent. No notable atheromatous changes. Vertebral arteries: Proximal subclavian atherosclerosis without flow limiting stenosis. Mild atheromatous narrowing of the right V1 segment. No high-grade stenosis, ulceration, or dissection. Skeleton: No acute or aggressive finding. Right retromastoid craniotomy, reportedly for brain metastasis resection. Other neck: No incidental mass or adenopathy. Upper chest: Subpleural reticulation in the right upper lobe, likely from radiotherapy. Partly seen changes of right axillary dissection. Review of the MIP images confirms the above findings CTA HEAD FINDINGS Anterior circulation: No branch occlusion or flow limiting stenosis. Mild bilateral M2 segment narrowings, most notable in the left MCA inferior division. Negative for aneurysm or beading. Posterior circulation: Essentially codominant vertebral arteries that are tortuous. Hypoplastic right P1 segment. Symmetric good opacification of bilateral posterior cerebral arteries. Unremarkable bilateral cerebellar arteries. Venous sinuses: Patent on the delayed phase. Anatomic variants: As above Delayed phase: No abnormal intracranial enhancement to suggest recurrent metastatic disease. There is encephalomalacia deep to the right retromastoid craniotomy, reported site of brain metastasis treatment. Confluent  low-density in the cerebral white matter with central atrophy and ventriculomegaly, suspected sequela of whole-brain radiotherapy. Case reviewed in person with Dr. Leonel Ramsay. Review of the MIP images confirms the above findings IMPRESSION: 1. No emergent large vessel occlusion. 2. Overall mild atherosclerosis. No flow limiting stenosis in the head or neck. 3. History of brain metastases with surgery and radiation. No evidence of active metastatic disease on postcontrast scan. Electronically Signed   By: Monte Fantasia M.D.   On: 09/02/2017 11:37   Mr Jeri Cos IR Contrast  Result Date: 09/03/2017 CLINICAL DATA:  Acute altered mental status EXAM: MRI HEAD WITHOUT AND WITH CONTRAST TECHNIQUE: Multiplanar, multiecho pulse sequences of the brain and surrounding structures were obtained without and with intravenous contrast. CONTRAST:  65mL MULTIHANCE GADOBENATE DIMEGLUMINE 529 MG/ML IV SOLN COMPARISON:  Head CT 09/02/2017 Brain MRI 08/30/2017 FINDINGS: Brain: The midline structures are normal. There is no focal diffusion restriction to indicate acute infarct. Old right cerebellar infarct. There is diffuse confluent hyperintense T2-weighted signal within the periventricular, juxtacortical and deep white matter, most often seen in the setting of chronic microvascular ischemia. There are multiple foci of chronic microhemorrhage within the cerebellum and left parietal lobe. No acute hemorrhage. The chronic micro hemorrhagic foci are predominantly peripheral in distribution. No abnormal parenchymal contrast enhancement. Volume loss is greater than expected for age. The dura is normal and there is no extra-axial collection. Vascular: Major intracranial arterial and venous sinus flow voids are preserved. Skull and upper cervical spine: The visualized skull base, calvarium, upper cervical spine and extracranial soft tissues are normal. Sinuses/Orbits: No fluid levels or advanced mucosal thickening. No mastoid or middle  ear  effusion. Normal orbits. IMPRESSION: 1. No acute abnormality. 2. Severe chronic ischemic microangiopathy, age advanced volume loss and old right cerebellar infarct. 3. Few scattered, peripheral predominant foci of chronic microhemorrhage. This may indicate underlying cerebral amyloid angiopathy. Electronically Signed   By: Ulyses Jarred M.D.   On: 09/03/2017 06:53   Ct Head Code Stroke Wo Contrast`  Result Date: 09/02/2017 CLINICAL DATA:  Code stroke. Altered mental status. Abnormal speech. New onset right-sided weakness. Right facial droop. Symptoms began 4 days ago. Personal history of metastatic breast cancer with whole-brain radiation. EXAM: CT HEAD WITHOUT CONTRAST TECHNIQUE: Contiguous axial images were obtained from the base of the skull through the vertex without intravenous contrast. COMPARISON:  CT head without contrast 08/29/2017. MRI brain 08/30/2017. FINDINGS: Brain: Extensive white matter hypoattenuation is again noted. No acute cortical lesion is present. Dilated perivascular spaces versus remote lacunar infarcts of the basal ganglia are stable. No acute hemorrhage or mass lesion is present. The ventricles are proportionate to the degree of atrophy. No significant extra-axial fluid collection is present. Postsurgical changes are present in the right cerebellum. Vascular: Vascular calcifications are again noted within the cavernous internal carotid artery is. There is no hyperdense vessel. Skull: Right occipital craniotomy is noted. The calvarium is otherwise intact. No focal lytic or blastic lesions are present. Sinuses/Orbits: The paranasal sinuses and mastoid air cells are clear. Bilateral globes and orbits are within normal limits. ASPECTS [facility] Stroke Program Early CT Score) - Ganglionic level infarction (caudate, lentiform nuclei, internal capsule, insula, M1-M3 cortex): 7/7 - Supraganglionic infarction (M4-M6 cortex): 3/3 Total score (0-10 with 10 being normal): 10/10 IMPRESSION: 1. No  acute intracranial abnormality or significant interval change. 2. Stable diffuse white matter changes compatible with prior whole-brain radiation. 3. Postsurgical changes of the right cerebellum 4. ASPECTS is 10/10 These results were text paged at the time of interpretation on 09/02/2017 at 11:18 am to Dr. Leonel Ramsay. Electronically Signed   By: San Morelle M.D.   On: 09/02/2017 11:19   Results/Tests Pending at Time of Discharge:   Discharge Medications:  Allergies as of 09/03/2017      Reactions   Morphine And Related Nausea And Vomiting      Medication List    TAKE these medications   acetaminophen 650 MG CR tablet Commonly known as:  TYLENOL 8 HOUR Take 1 tablet (650 mg total) by mouth every 8 (eight) hours as needed for pain.   aspirin EC 81 MG tablet Take 81 mg by mouth daily.   atorvastatin 20 MG tablet Commonly known as:  LIPITOR Take 20 mg by mouth daily.   cephALEXin 250 MG capsule Commonly known as:  KEFLEX Take 1 capsule (250 mg total) by mouth every 6 (six) hours.   docusate sodium 100 MG capsule Commonly known as:  COLACE Take 1 capsule (100 mg total) by mouth every 12 (twelve) hours.   HYDROcodone-acetaminophen 5-325 MG tablet Commonly known as:  NORCO/VICODIN Take 1 tablet by mouth every 12 (twelve) hours as needed for moderate pain.   levETIRAcetam 500 MG tablet Commonly known as:  KEPPRA Take 2 tablets (1,000 mg total) by mouth 2 (two) times daily.   lidocaine 5 % Commonly known as:  LIDODERM Place 1 patch onto the skin daily. Remove & Discard patch within 12 hours or as directed by MD   methocarbamol 500 MG tablet Commonly known as:  ROBAXIN Take 1 tablet (500 mg total) by mouth 2 (two) times daily.   oxyCODONE-acetaminophen 5-325 MG  tablet Commonly known as:  PERCOCET/ROXICET Take 2 tablets by mouth every 4 (four) hours as needed for severe pain.            Discharge Care Instructions        Start     Ordered   09/03/17 0000   cephALEXin (KEFLEX) 250 MG capsule  Every 6 hours     09/03/17 1245   09/03/17 0000  levETIRAcetam (KEPPRA) 500 MG tablet  2 times daily     09/03/17 1245      Discharge Instructions: Please refer to Patient Instructions section of EMR for full details.  Patient was counseled important signs and symptoms that should prompt return to medical care, changes in medications, dietary instructions, activity restrictions, and follow up appointments.   Follow-Up Appointments:   Ashwath Lasch, [place], DO 09/03/2017, 2:26 PM PGY-1, [place]

## 2017-08-30 NOTE — Progress Notes (Signed)
PT Cancellation Note  Patient Details Name: Cynthia Massey MRN: [id number] DOB: [birth year]   Cancelled Treatment:    Reason Eval/Treat Not Completed: Other (comment), needed pain meds and was having both ribcage and head pain.  Will try again in the AM.   Ramond Dial 08/30/2017, 5:01 PM   Mee Hives, PT MS Acute Rehab Dept. Number: [place] [facility] [place]

## 2017-08-30 NOTE — ED Notes (Signed)
MRI called and stated patient would not hold still. Neurohospitalist stated to defer for now

## 2017-08-31 ENCOUNTER — Inpatient Hospital Stay (HOSPITAL_COMMUNITY): Payer: Medicare Other

## 2017-08-31 DIAGNOSIS — R569 Unspecified convulsions: Secondary | ICD-10-CM

## 2017-08-31 DIAGNOSIS — R55 Syncope and collapse: Secondary | ICD-10-CM

## 2017-08-31 DIAGNOSIS — L8990 Pressure ulcer of unspecified site, unspecified stage: Secondary | ICD-10-CM | POA: Insufficient documentation

## 2017-08-31 LAB — BASIC METABOLIC PANEL
Anion gap: 7 (ref 5–15)
BUN: 11 mg/dL (ref 6–20)
CHLORIDE: 108 mmol/L (ref 101–111)
CO2: 22 mmol/L (ref 22–32)
Calcium: 8.9 mg/dL (ref 8.9–10.3)
Creatinine, Ser: 0.63 mg/dL (ref 0.44–1.00)
GFR calc Af Amer: >60 mL/min (ref >60)
GLUCOSE: 101 mg/dL — AB (ref 65–99)
POTASSIUM: 4.2 mmol/L (ref 3.5–5.1)
Sodium: 137 mmol/L (ref 135–145)

## 2017-08-31 MED ORDER — CEPHALEXIN 250 MG PO CAPS
250.0000 mg | ORAL_CAPSULE | Freq: Four times a day (QID) | ORAL | Status: DC
  Administered 2017-08-31 – 2017-09-03 (×14): 250 mg via ORAL
  Filled 2017-08-31 (×13): qty 1

## 2017-08-31 MED ORDER — ATORVASTATIN CALCIUM 10 MG PO TABS
20.0000 mg | ORAL_TABLET | Freq: Every day | ORAL | Status: DC
  Administered 2017-08-31 – 2017-09-03 (×4): 20 mg via ORAL
  Filled 2017-08-31 (×4): qty 2

## 2017-08-31 MED ORDER — LORAZEPAM 2 MG/ML IJ SOLN
0.5000 mg | Freq: Four times a day (QID) | INTRAMUSCULAR | Status: DC | PRN
  Administered 2017-08-31 – 2017-09-01 (×2): 0.5 mg via INTRAVENOUS
  Filled 2017-08-31 (×2): qty 1

## 2017-08-31 MED ORDER — LEVETIRACETAM 500 MG PO TABS
500.0000 mg | ORAL_TABLET | Freq: Two times a day (BID) | ORAL | Status: DC
  Administered 2017-08-31 – 2017-09-03 (×7): 500 mg via ORAL
  Filled 2017-08-31 (×7): qty 1

## 2017-08-31 NOTE — Progress Notes (Signed)
Neurology Progress Note   S:// Seen and examined this morning. Much more awake and alert.  O:// Current vital signs: BP (!) 152/64 (BP Location: Left Leg)   Pulse 70   Temp 98.1 F (36.7 C) (Oral)   Resp 16   Ht 5\' 7"  (1.702 m)   Wt 70.2 kg (154 lb 12.2 oz)   SpO2 99%   BMI 24.24 kg/m  Vital signs in last 24 hours: Temp:  [97.6 F (36.4 C)-98.3 F (36.8 C)] 98.1 F (36.7 C) (09/22 0916) Pulse Rate:  [70-94] 70 (09/22 0916) Resp:  [11-22] 16 (09/22 0916) BP: (136-165)/(64-79) 152/64 (09/22 0916) SpO2:  [94 %-99 %] 99 % (09/22 0916) GENERAL: Awake, alert in NAD HEENT: - Normocephalic and atraumatic, dry mm, no LN++, no Thyromegally LUNGS - Clear to auscultation bilaterally with no wheezes CV - S1S2 RRR, no m/r/g, equal pulses bilaterally. ABDOMEN - Soft, nontender, nondistended with normoactive BS Ext: warm, well perfused, intact peripheral pulses, no edema NEURO:  Mental Status: Awake alert oriented 2. Language: speech is mildly dysarthric but his baseline per her husband  Naming, repetition, fluency, and comprehension intact. Cranial Nerves: PERRL 16mm/brisk. EOMI, visual fields full, mild right lower facial weakness, facial sensation intact, hearing intact, tongue/uvula/soft palate midline, normal sternocleidomastoid and trapezius muscle strength. No evidence of tongue atrophy or fibrillations Motor: Antigravity in all fours Tone: is normal and bulk is normal Sensation- Intact to light touch bilaterally Coordination: Dysmetric on finger-nose-finger testing on the right, intact finger nose finger on the left. Gait- wide-based, walks with support.   Medications  Current Facility-Administered Medications:  .   stroke: mapping our early stages of recovery book, , Does not apply, Once, Marjie Skiff, MD, Stopped at 08/30/17 0828 .  0.9 %  sodium chloride infusion, , Intravenous, Continuous, Diallo, Abdoulaye, MD, Last Rate: 100 mL/hr at 08/30/17 0910 .  acetaminophen  (TYLENOL) tablet 500 mg, 500 mg, Oral, Q6H PRN, Sela Hilding, MD, 500 mg at 08/30/17 2059 .  enoxaparin (LOVENOX) injection 40 mg, 40 mg, Subcutaneous, Q24H, Diallo, Abdoulaye, MD, 40 mg at 08/30/17 1658 .  levETIRAcetam (KEPPRA) 500 mg in sodium chloride 0.9 % 100 mL IVPB, 500 mg, Intravenous, Q12H, Amie Portland, MD, Stopped at 08/30/17 2115 .  lidocaine (LIDODERM) 5 % 1 patch, 1 patch, Transdermal, Q24H, Diallo, Abdoulaye, MD, 1 patch at 08/31/17 0130 Labs CBC    Component Value Date/Time   WBC 9.1 08/29/2017 2151   RBC 4.34 08/29/2017 2151   HGB 12.2 08/29/2017 2156   HCT 36.0 08/29/2017 2156   PLT 266 08/29/2017 2151   MCV 86.9 08/29/2017 2151   MCH 27.6 08/29/2017 2151   MCHC 31.8 08/29/2017 2151   RDW 14.5 08/29/2017 2151   LYMPHSABS 2.4 08/29/2017 2151   MONOABS 0.6 08/29/2017 2151   EOSABS 0.2 08/29/2017 2151   BASOSABS 0.0 08/29/2017 2151    CMP     Component Value Date/Time   NA 139 08/29/2017 2156   K 3.6 08/29/2017 2156   CL 103 08/29/2017 2156   CO2 25 08/29/2017 2151   GLUCOSE 126 (H) 08/29/2017 2156   BUN 20 08/29/2017 2156   CREATININE 0.70 08/29/2017 2156   CALCIUM 8.8 (L) 08/29/2017 2151   PROT 6.3 (L) 08/29/2017 2151   ALBUMIN 3.6 08/29/2017 2151   AST 46 (H) 08/29/2017 2151   ALT 51 08/29/2017 2151   ALKPHOS 100 08/29/2017 2151   BILITOT 0.6 08/29/2017 2151   GFRNONAA >60 08/29/2017 2151   GFRAA >60 08/29/2017  2151      Lipid Panel     Component Value Date/Time   CHOL 140 08/30/2017 0246   TRIG 101 08/30/2017 0246   HDL 50 08/30/2017 0246   CHOLHDL 2.8 08/30/2017 0246   VLDL 20 08/30/2017 0246   LDLCALC 70 08/30/2017 0246    Urinalysis is suggestive of UTI.  Imaging I have reviewed images in epic and the results pertinent to this consultation are:  CT-scan of the brain no acute changes MRI examination of the brain also reveals no acute changes, motion degraded exam with extensive microangiopathy, most likely result in from  prior radiation/chemotherapy  EEG: diffuse slowing, no electrographic seizures or focal slowing.  Assessment:  71 year old woman with a past medical history of resected metastatic breast cancer to the right cerebellum, followed by whole brain radiation, history of progressive dementia, history of TIAs with aphasia and spells of passing out, presented to the emergency room as an acute code stroke for right gaze deviation and unresponsiveness. Initially evaluated by neurology and differentials included complex partial seizures and toxic metabolic encephalopathy. MRI brain performed, no acute stroke. EEG showed diffuse slowing and no electrographic seizures indicative of the underlying encephalopathy Laboratory testing suggestive of a UTI.  Impression: Toxic metabolic encephalopathy Most likely due to underlying UTI History of cognitive impairment Possible complex partial seizure given extensive chronic microangiopathy of the brain and history of radiation/chemotherapy for metastatic breast cancer  Recommendations: At this time, I would recommend continuing Keppra 500 twice a day. Medications can be made by mouth as she is now cleared for diet. Seizure precautions. She should at some point be evaluated with a fall MRI brain with and without contrast for any evidence of meningeal enhancement or any other enhancing lesions that could reflect metastatic disease. Treatment of the UTI per primary team. Follow-up with outpatient primary care and outpatient neurology upon return to [place], [place], the patient's hometown.  Plan relayed to primary team resident over the phone.  Neurology will be available as needed. Please call with questions.  -- Amie Portland, MD [facility] [phone]  If 7pm to 7am, please call on call as listed on AMION.
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
## 2017-08-31 NOTE — Progress Notes (Addendum)
Family Medicine Teaching Service Daily Progress Note Intern Pager: [phone]  Patient name: Cynthia Massey Medical record number: [id number] Date of birth: 11-16-1946 Age: 71 y.o. Gender: female  Primary Care Provider: System, Pcp Not In Consultants: Neurology Code Status: Full  Pt Overview and Major Events to Date:  9/21: Admitted for LOC and AMS  Assessment and Plan: Cynthia Massey is a 71 y.o. female presenting with LOC and slump to the right side. 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  LOC with AMS due to seizure vs stroke: Improving per husband. EEG with diffuse cerebral dysfunction. MRI without any obvious acute abnormalities, but poor imaging study due to patient movement. - Neurology following, appreciate recommendations. Continue Keppra 500mg  bid and needs MRI brain w/ and w/o contrast to evaluate for metastatic disease. - Telemetry, continuous pulse ox - ECHO and carotid US pending - PT/OT consult pending - Vitals per floor - Neuro checks q2 hours for 12 hours  UTI: UA with trace LE, positive nitrites, many bacteria, 6-30 WBC. Not having any fevers, leukocytosis, suprapubic pain, gross hematuria, new incontinence/urgency/frequency, so does not meet McGeer's criteria; however, because she is altered and we don't have a great reason for her AMS, will treat for UTI. - Start Keflex qid x 7 days - Urine culture ordered  Recent fractured ribs due to falls: Patient with 2 falls in the last week. CT head negative for intracranial bleed. - Lidocaine patch - Tylenol prn - Incentive spirometry ordered  Hypokalemia: K 3.4 in the ED. - Repeat BMP pending  H/o of breast cancer with metastatic brain tumor to the R cerebellum  Patient has a history of breast cancer with bilateral mastectomy in 2001. Patient was found to have metastasis to  cerebellum in 2004 and underwent whole brain radiation.  HLD: Stable. On Lipitor 20 mg at home. - Restart home Lipitor 20mg  daily  FEN/GI: clear liquids Prophylaxis: Lovenox  Disposition: Anticipate discharge home in the next 1-2 days.  Subjective:  Patient states she has no concerns this morning. She denies any fevers or chills. She denies abdominal pain. She denies headache. No dysuria. She did have an accident last night with bowel and bladder incontinence because she was unable to make it to the bathroom in time.  Objective: Temp:  [97.6 F (36.4 C)-98.3 F (36.8 C)] 97.6 F (36.4 C) (09/22 0500) Pulse Rate:  [71-95] 71 (09/22 0500) Resp:  [10-22] 18 (09/22 0500) BP: (136-165)/(70-79) 155/71 (09/22 0500) SpO2:  [94 %-99 %] 98 % (09/22 0500) Physical Exam: General: Awake, alert, in NAD Cardiovascular: RRR Respiratory: CTAB, normal work of breathing. Gastrointestinal: soft, nondistended, normoactive BS, no suprapubic tenderness Extremities: no LE edema noted Neuro: Answers questions appropriately, but occasionally says a sentence that doesn't make sense.  Laboratory:  Recent Labs Lab 08/26/17 1410 08/28/17 0942 08/29/17 2151 08/29/17 2156  WBC 8.9 6.7 9.1  --   HGB 13.2 12.0 12.0 12.2  HCT 40.2 36.4 37.7 36.0  PLT 296 269 266  --     Recent Labs Lab 08/26/17 1410 08/28/17 0942 08/29/17 2151 08/29/17 2156  NA 141 138 136 139  K 3.5 4.0 3.4* 3.6  CL 108 107 106 103  CO2 26 26 25   --   BUN 16 18 18 20   CREATININE 0.71 0.74 0.78 0.70  CALCIUM 8.9 9.2 8.8*  --   PROT 7.2 6.8 6.3*  --  BILITOT 0.8 0.6 0.6  --   ALKPHOS 73 75 100  --   ALT 19 18 51  --   AST 22 21 46*  --   GLUCOSE 95 110* 129* 126*   tot chol 140, TG 101, HDL 50, LDL 70 Hbg A1c 5.3  Imaging/Diagnostic Tests: Dg Chest 2 View  Result Date: 08/30/2017 CLINICAL DATA:  Chest pain EXAM: CHEST  2 VIEW COMPARISON:  08/28/2017, 08/26/2017 FINDINGS: Low lung volumes. No acute consolidation  or pleural effusion. Borderline cardiomegaly, augmented by low lung volume. No pneumothorax. Surgical clips in the left upper quadrant and right chest. Compression deformity at T12. IMPRESSION: Low lung volumes with borderline cardiomegaly. No infiltrate or edema Electronically Signed   By: Donavan Foil M.D.   On: 08/30/2017 02:30   Mr Brain Wo Contrast  Result Date: 08/30/2017 CLINICAL DATA:  Loss of consciousness.  Seizure. EXAM: MRI HEAD WITHOUT CONTRAST TECHNIQUE: Multiplanar, multiecho pulse sequences of the brain and surrounding structures were obtained without intravenous contrast. COMPARISON:  Head CT 08/29/2017 FINDINGS: The study is degraded by motion, despite efforts to reduce this artifact, including utilization of motion-resistant MR sequences. The findings of the study are interpreted in the context of reduced sensitivity/specificity. Brain: The midline structures are normal. There is no focal diffusion restriction to indicate acute infarct. Old right cerebellar infarct. There is diffuse confluent hyperintense T2-weighted signal within the periventricular, deep and juxtacortical white matter, most often seen in the setting of chronic microvascular ischemia. No intraparenchymal hematoma or chronic microhemorrhage. There is diffuse volume loss. The dura is normal and there is no extra-axial collection. Vascular: Major intracranial arterial and venous sinus flow voids are preserved. Skull and upper cervical spine: The visualized skull base, calvarium, upper cervical spine and extracranial soft tissues are normal. Sinuses/Orbits: No fluid levels or advanced mucosal thickening. No mastoid or middle ear effusion. Normal orbits. IMPRESSION: 1. Motion degraded examination. Within that limitation, no visualized acute abnormality. 2. Advanced volume loss and chronic ischemic microangiopathy. Electronically Signed   By: Ulyses Jarred M.D.   On: 08/30/2017 02:09   Ct Head Code Stroke Wo Contrast  Result  Date: 08/29/2017 CLINICAL DATA:  Code stroke. Initial evaluation for acute right-sided facial droop. EXAM: CT HEAD WITHOUT CONTRAST TECHNIQUE: Contiguous axial images were obtained from the base of the skull through the vertex without intravenous contrast. COMPARISON:  Prior CT from 08/26/2017. FINDINGS: Brain: Stable atrophy with advanced cerebral white matter changes. Encephalomalacia out within the right cerebellar hemisphere, unchanged. No acute intracranial hemorrhage. No evidence for acute large vessel territory infarct. No mass lesion, midline shift or mass effect. Ventricular prominence related underlying atrophy without hydrocephalus. No extra-axial fluid collection. Vascular: No hyperdense vessel. Scattered vascular calcifications noted within the carotid siphons. Skull: Scalp soft tissues demonstrate no acute abnormality. Post craniotomy changes from remote right occipital craniotomy again noted. Sinuses/Orbits: Globes oral soft tissues within normal limits. Paranasal sinuses are clear. No mastoid effusion. Other: None. ASPECTS [facility] Stroke Program Early CT Score) - Ganglionic level infarction (caudate, lentiform nuclei, internal capsule, insula, M1-M3 cortex): 7 - Supraganglionic infarction (M4-M6 cortex): 3 Total score (0-10 with 10 being normal): 10 IMPRESSION: 1. No acute intracranial infarct or other process identified. 2. ASPECTS is 10 3. Stable atrophy with advanced cerebral white matter hypoattenuation. 4. Sequelae of previous right occipital craniotomy with underlying right cerebellar encephalomalacia, stable. Critical Value/emergent results were called by telephone at the time of interpretation on 08/29/2017 at 10:20 pm to Dr. Nicole Kindred , who verbally  acknowledged these results. Electronically Signed   By: Jeannine Boga M.D.   On: 08/29/2017 22:24    May[place], Pete Pelt, MD 08/31/2017, 8:06 AM PGY-3, [facility] Intern pager: [phone], text pages welcome

## 2017-08-31 NOTE — Progress Notes (Signed)
VASCULAR LAB PRELIMINARY  PRELIMINARY  PRELIMINARY  PRELIMINARY  Carotid duplex completed.    Preliminary report:  1-39% ICA plaquing. Vertebral artery flow is antegrade.   Cynthia Massey, RVT 08/31/2017, 9:31 AM

## 2017-09-01 ENCOUNTER — Inpatient Hospital Stay (HOSPITAL_COMMUNITY): Payer: Medicare Other

## 2017-09-01 DIAGNOSIS — I6789 Other cerebrovascular disease: Secondary | ICD-10-CM

## 2017-09-01 LAB — BASIC METABOLIC PANEL
Anion gap: 6 (ref 5–15)
BUN: 11 mg/dL (ref 6–20)
CHLORIDE: 108 mmol/L (ref 101–111)
CO2: 25 mmol/L (ref 22–32)
CREATININE: 0.67 mg/dL (ref 0.44–1.00)
Calcium: 8.9 mg/dL (ref 8.9–10.3)
GFR calc Af Amer: >60 mL/min (ref >60)
GFR calc non Af Amer: >60 mL/min (ref >60)
GLUCOSE: 95 mg/dL (ref 65–99)
Potassium: 3.1 mmol/L — ABNORMAL LOW (ref 3.5–5.1)
Sodium: 139 mmol/L (ref 135–145)

## 2017-09-01 LAB — VAS US CAROTID
LCCADDIAS: -10 cm/s
LCCAPDIAS: 13 cm/s
LCCAPSYS: 66 cm/s
LEFT ECA DIAS: -10 cm/s
LEFT VERTEBRAL DIAS: -13 cm/s
LICADDIAS: -19 cm/s
Left CCA dist sys: -39 cm/s
Left ICA dist sys: -47 cm/s
Left ICA prox dias: -13 cm/s
Left ICA prox sys: -38 cm/s
RCCAPSYS: 63 cm/s
RIGHT ECA DIAS: -8 cm/s
RIGHT VERTEBRAL DIAS: 8 cm/s
Right CCA prox dias: 14 cm/s
Right cca dist sys: -43 cm/s

## 2017-09-01 LAB — ECHOCARDIOGRAM COMPLETE
AOASC: 33 cm
E/e' ratio: 15.17
EWDT: 211 ms
FS: 35 % (ref 28–44)
Height: 67 in
IVS/LV PW RATIO, ED: 0.71
LA ID, A-P, ES: 33 mm
LA diam end sys: 33 mm
LA diam index: 1.8 cm/m2
LA vol index: 18 mL/m2
LA vol: 33 mL
LAVOLA4C: 31.4 mL
LV E/e'average: 15.17
LV PW d: 12.8 mm — AB (ref 0.6–1.1)
LV TDI E'LATERAL: 5.44
LV TDI E'MEDIAL: 5.77
LV e' LATERAL: 5.44 cm/s
LVEEMED: 15.17
LVOT area: 2.54 cm2
LVOT diameter: 18 mm
MV Dec: 211
MV Peak grad: 3 mmHg
MVPKAVEL: 101 m/s
MVPKEVEL: 82.5 m/s
RV LATERAL S' VELOCITY: 14.6 cm/s
RV TAPSE: 15.5 mm
RV sys press: 17 mmHg
Reg peak vel: 189 cm/s
TRMAXVEL: 189 cm/s
WEIGHTICAEL: 2476.21 [oz_av]

## 2017-09-01 NOTE — Progress Notes (Signed)
FPTS Interim Progress Note  S:Patient seen with daughter and husband in the room. Family reporting that she appears at baseline. Patient reporting that she has no headache or changes in vision.   O: BP 125/64 (BP Location: Right Leg)   Pulse 67   Temp 97.6 F (36.4 C) (Oral)   Resp 16   Ht 5\' 7"  (1.702 m)   Wt 154 lb 12.2 oz (70.2 kg)   SpO2 99%   BMI 24.24 kg/m   General: NAD, sitting up in bed and talking Head: older, healing bruise noted on parietal area Neruo: CN II-XII grossly intact Psych: Patient can follow commands and answers questions appropriately with confusion. Oriented to self, not time or place    A/P: Patient appears to be at baseline per family, and as she was this morning on exam prior to her fall in the restroom. She is oriented to self as she was this morning. Will continue to monitor.  Jabarri Stefanelli, [place], DO 09/01/2017, 4:13 PM PGY-1, [facility] pager [phone]

## 2017-09-01 NOTE — Progress Notes (Signed)
While assisting patient back into room from bathroom, patient became very anxious about her modesty and cleanliness, refusing to follow nurse directions.  Nurse had both hands on the patient attempting to keep her steady and focused on returning to bed, but patient pulled one hand free to wrap gown around herself, shifting her center of balance. She sat down in the floor and then fell backward, striking head on the wall.  Doctor and family right outside of the bathroom door.  Full assessment and vital signs performed with no changes.  No injuries noted to patient.  Will continue to monitor for changes to patient's physical or mental baseline. Wendee Copp

## 2017-09-01 NOTE — Progress Notes (Signed)
Family Medicine Teaching Service Daily Progress Note Intern Pager: [phone]  Patient name: Cynthia Massey Medical record number: [id number] Date of birth: 1946-08-04 Age: 71 y.o. Gender: female  Primary Care Provider: System, Pcp Not In Consultants: Neurology Code Status: Full  Pt Overview and Major Events to Date:  9/21: Admitted for LOC and AMS  Assessment and Plan: Cynthia Massey is a 71 y.o. female presenting with LOC and slump to the right side. 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  LOC with AMS due to seizure vs stroke: Improving per husband. EEG with diffuse cerebral dysfunction. MRI without any obvious acute abnormalities, but poor imaging study due to patient movement. - Neurology following, appreciate recommendations. Continue Keppra 500mg  bid and needs MRI brain w/ and w/o contrast to evaluate for metastatic disease. - Can d/c Telemetry, continuous pulse ox - ECHO pending and carotid US preliminary report with no significant stenosis noted - HH PT, HH OT, HH SLP - Vitals per floor - Neuro checks q2 hours for 12 hours  UTI: UA with trace LE, positive nitrites, many bacteria, 6-30 WBC. Not having any fevers, leukocytosis, suprapubic pain, gross hematuria, new incontinence/urgency/frequency, so does not meet McGeer's criteria; however, because she is altered and we don't have a great reason for her AMS, will treat for UTI. - Start Keflex qid x 7 days - Urine culture with gram negative rods  Recent fractured ribs due to falls: Patient with 2 falls in the last week. CT head negative for intracranial bleed. - Lidocaine patch - Tylenol prn - Incentive spirometry ordered  Hypokalemia: Resolved. K 4.2 on 09/23. - Monitor  H/o of breast cancer with metastatic brain tumor to the R cerebellum: Patient has a history of breast cancer with  bilateral mastectomy in 2001. Patient was found to have metastasis to cerebellum in 2004 and underwent whole brain radiation.  HLD: Stable. On Lipitor 20 mg at home. - Restart home Lipitor 20mg  daily  FEN/GI: clear liquids Prophylaxis: Lovenox  Disposition: Discharge home today with [facility] in [place]  Subjective:  Patient states she feels fine this morning. She has pain with movement near her ribs. She is ready to go.   Objective: Temp:  [97.5 F (36.4 C)-98.3 F (36.8 C)] 97.7 F (36.5 C) (09/23 0502) Pulse Rate:  [64-74] 64 (09/23 0502) Resp:  [15-18] 15 (09/23 0502) BP: (130-160)/(64-88) 131/88 (09/23 0502) SpO2:  [98 %-99 %] 98 % (09/23 0502) Physical Exam: General: Awake, alert, in NAD Cardiovascular: RRR Respiratory: CTAB, normal work of breathing Gastrointestinal: soft, nondistended, normoactive BS, no suprapubic tenderness Extremities: no LE edema noted Neuro: Answers questions appropriately, but occasionally says a sentence that doesn't make sense. Alert to self only.   Laboratory:  Recent Labs Lab 08/26/17 1410 08/28/17 0942 08/29/17 2151 08/29/17 2156  WBC 8.9 6.7 9.1  --   HGB 13.2 12.0 12.0 12.2  HCT 40.2 36.4 37.7 36.0  PLT 296 269 266  --     Recent Labs Lab 08/26/17 1410 08/28/17 0942 08/29/17 2151 08/29/17 2156 08/31/17 1241  NA 141 138 136 139 137  K 3.5 4.0 3.4* 3.6 4.2  CL 108 107 106 103 108  CO2 26 26 25   --  22  BUN 16 18 18 20 11   CREATININE 0.71 0.74 0.78 0.70 0.63  CALCIUM 8.9 9.2 8.8*  --  8.9  PROT 7.2 6.8 6.3*  --   --  BILITOT 0.8 0.6 0.6  --   --   ALKPHOS 73 75 100  --   --   ALT 19 18 51  --   --   AST 22 21 46*  --   --   GLUCOSE 95 110* 129* 126* 101*   tot chol 140, TG 101, HDL 50, LDL 70 Hbg A1c 5.3  Imaging/Diagnostic Tests: Dg Chest 2 View  Result Date: 08/30/2017 CLINICAL DATA:  Chest pain EXAM: CHEST  2 VIEW COMPARISON:  08/28/2017, 08/26/2017 FINDINGS: Low lung volumes. No acute consolidation or pleural  effusion. Borderline cardiomegaly, augmented by low lung volume. No pneumothorax. Surgical clips in the left upper quadrant and right chest. Compression deformity at T12. IMPRESSION: Low lung volumes with borderline cardiomegaly. No infiltrate or edema Electronically Signed   By: Donavan Foil M.D.   On: 08/30/2017 02:30   Mr Brain Wo Contrast  Result Date: 08/30/2017 CLINICAL DATA:  Loss of consciousness.  Seizure. EXAM: MRI HEAD WITHOUT CONTRAST TECHNIQUE: Multiplanar, multiecho pulse sequences of the brain and surrounding structures were obtained without intravenous contrast. COMPARISON:  Head CT 08/29/2017 FINDINGS: The study is degraded by motion, despite efforts to reduce this artifact, including utilization of motion-resistant MR sequences. The findings of the study are interpreted in the context of reduced sensitivity/specificity. Brain: The midline structures are normal. There is no focal diffusion restriction to indicate acute infarct. Old right cerebellar infarct. There is diffuse confluent hyperintense T2-weighted signal within the periventricular, deep and juxtacortical white matter, most often seen in the setting of chronic microvascular ischemia. No intraparenchymal hematoma or chronic microhemorrhage. There is diffuse volume loss. The dura is normal and there is no extra-axial collection. Vascular: Major intracranial arterial and venous sinus flow voids are preserved. Skull and upper cervical spine: The visualized skull base, calvarium, upper cervical spine and extracranial soft tissues are normal. Sinuses/Orbits: No fluid levels or advanced mucosal thickening. No mastoid or middle ear effusion. Normal orbits. IMPRESSION: 1. Motion degraded examination. Within that limitation, no visualized acute abnormality. 2. Advanced volume loss and chronic ischemic microangiopathy. Electronically Signed   By: Ulyses Jarred M.D.   On: 08/30/2017 02:09   Ct Head Code Stroke Wo Contrast  Result Date:  08/29/2017 CLINICAL DATA:  Code stroke. Initial evaluation for acute right-sided facial droop. EXAM: CT HEAD WITHOUT CONTRAST TECHNIQUE: Contiguous axial images were obtained from the base of the skull through the vertex without intravenous contrast. COMPARISON:  Prior CT from 08/26/2017. FINDINGS: Brain: Stable atrophy with advanced cerebral white matter changes. Encephalomalacia out within the right cerebellar hemisphere, unchanged. No acute intracranial hemorrhage. No evidence for acute large vessel territory infarct. No mass lesion, midline shift or mass effect. Ventricular prominence related underlying atrophy without hydrocephalus. No extra-axial fluid collection. Vascular: No hyperdense vessel. Scattered vascular calcifications noted within the carotid siphons. Skull: Scalp soft tissues demonstrate no acute abnormality. Post craniotomy changes from remote right occipital craniotomy again noted. Sinuses/Orbits: Globes oral soft tissues within normal limits. Paranasal sinuses are clear. No mastoid effusion. Other: None. ASPECTS [facility] Stroke Program Early CT Score) - Ganglionic level infarction (caudate, lentiform nuclei, internal capsule, insula, M1-M3 cortex): 7 - Supraganglionic infarction (M4-M6 cortex): 3 Total score (0-10 with 10 being normal): 10 IMPRESSION: 1. No acute intracranial infarct or other process identified. 2. ASPECTS is 10 3. Stable atrophy with advanced cerebral white matter hypoattenuation. 4. Sequelae of previous right occipital craniotomy with underlying right cerebellar encephalomalacia, stable. Critical Value/emergent results were called by telephone at the time  of interpretation on 08/29/2017 at 10:20 pm to Dr. Nicole Kindred , who verbally acknowledged these results. Electronically Signed   By: Jeannine Boga M.D.   On: 08/29/2017 22:24    Anny Sayler, [place], DO 09/01/2017, 7:34 AM PGY-1, [place] Intern pager: [phone], text pages welcome

## 2017-09-01 NOTE — Progress Notes (Signed)
  Echocardiogram 2D Echocardiogram has been performed.  Cynthia Massey 09/01/2017, 12:34 PM

## 2017-09-01 NOTE — Evaluation (Signed)
Speech Language Pathology Evaluation Patient Details Name: Cynthia Massey MRN: [id number] DOB: September 30, 1946 Today's Date: 09/01/2017 Time: 7124-5809 SLP Time Calculation (min) (ACUTE ONLY): 33 min  Problem List:  Patient Active Problem List   Diagnosis Date Noted  . Pressure injury of skin 08/31/2017  . 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 Stroke ([place]) 08/30/2017  . Mental status alteration 08/30/2017  . Dementia 08/30/2017  . Falls frequently 08/30/2017  . Left rib fracture 08/30/2017  . Abnormality of gait due to impairment of balance 08/30/2017  . Encephalomalacia on imaging study 08/30/2017  . History of breast cancer 08/30/2017  . Vertebral fracture, osteoporotic ([place]) 08/30/2017  . Osteoporosis 08/30/2017  . History of external beam radiation therapy 12/10/2002   Past Medical History:  Past Medical History:  Diagnosis Date  . Arthritis    "knees, lower back" (08/30/2017)  . Breast cancer metastasized to brain [facility]) 2004   S/P chemo and radiation  . Breast cancer, right breast [place]  . Chronic lower back pain   . Dementia   . GERD (gastroesophageal reflux disease)   . Headache    "almost daily" (08/30/2017)  . Heart murmur   . Hyperlipidemia   . PONV (postoperative nausea and vomiting)   . Rib fractures 08/26/2017   "fell; broke 2 on the left"  . Seizures ([place])    "we were told there was evidence of previous seizures last night in ER; & she had more last night" (08/30/2017)   Past Surgical History:  Past Surgical History:  Procedure Laterality Date  . ABDOMINAL HYSTERECTOMY  1985  . APPENDECTOMY    . BRAIN TUMOR EXCISION  2004   "got it all; still had to have chemo & full brain radiation"  . BREAST SURGERY Right ~ 2000  . CHOLECYSTECTOMY OPEN  ~ 2013  . MASTECTOMY Bilateral 2001   "prophylactic on left side; cancer on right side; went from breast to brain 4 years later"   HPI:  71 y.o. female presenting with LOC and slump to the right side. 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 MRI brain performed, no acute stroke.    Assessment / Plan / Recommendation Clinical Impression  Patient presents with significant cognitive-communication impairment with deficits in sustained attention, short and long-term memory, problem solving, judgment and orientation. Husband and daughter report baseline deficits, with decline in cognition over the past year, worsening this hospitalization. No acute further acute needs identified, however patient would benefit from [facility] SLP in order to maxmize cognitive function, address awareness of deficits and precautions to reduce caregiver burden, improve safety awareness and quality of life.    SLP Assessment  SLP Recommendation/Assessment: All further Speech Lanaguage Pathology  needs can be addressed in the next venue of care SLP Visit Diagnosis: Cognitive communication deficit (R41.841)    Follow Up Recommendations  Home health SLP    Frequency and Duration           SLP Evaluation Cognition  Overall Cognitive Status: History of cognitive impairments - at baseline Arousal/Alertness: Awake/alert Orientation Level: Oriented to person;Disoriented to place;Disoriented to time;Disoriented to situation Attention: Focused;Sustained Focused Attention: Appears intact Sustained Attention: Impaired Sustained Attention Impairment: Verbal basic;Functional basic Memory: Impaired Memory Impairment: Decreased long term memory;Decreased short term memory;Decreased recall of new information Decreased Long Term Memory: Verbal basic;Functional basic Decreased Short Term Memory: Verbal basic;Functional basic Awareness: Impaired Awareness Impairment: Emergent  impairment;Anticipatory impairment Problem Solving: Impaired Problem Solving Impairment: Verbal basic;Functional basic Safety/Judgment: Impaired        Comprehension  Auditory Comprehension Overall Auditory Comprehension: Appears within functional limits for tasks assessed Visual Recognition/Discrimination Discrimination: Not tested Reading Comprehension Reading Status: Not tested    Expression Expression Primary Mode of Expression: Verbal Verbal Expression Overall Verbal Expression: Impaired Initiation: No impairment Level of Generative/Spontaneous Verbalization: Conversation Naming:  (hesitations, word finding difficulties in conversation) Interfering Components: Attention;Premorbid deficit Effective Techniques: Open ended questions Written Expression Dominant Hand: Right Written Expression: Not tested   Oral / Motor  Oral Motor/Sensory Function Overall Oral Motor/Sensory Function: Within functional limits Motor Speech Overall Motor Speech: Impaired at baseline Respiration: Within functional limits Phonation: Normal Resonance: Within functional limits Articulation: Impaired Level of Impairment: Conversation (minimal hypoarticulation, does not affect intelligibility) Intelligibility: Intelligible Motor Planning: Witnin functional limits Motor Speech Errors: Not applicable   [place], [place], [place] Pathologist [phone]        Aliene Altes 09/01/2017, 10:53 AM

## 2017-09-02 ENCOUNTER — Inpatient Hospital Stay (HOSPITAL_COMMUNITY): Payer: Medicare Other

## 2017-09-02 ENCOUNTER — Inpatient Hospital Stay (HOSPITAL_COMMUNITY)
Admit: 2017-09-02 | Discharge: 2017-09-02 | Disposition: A | Discharge status: Home / Self Care | Payer: Medicare Other | Attending: Neurology | Admitting: Neurology

## 2017-09-02 ENCOUNTER — Encounter (HOSPITAL_COMMUNITY): Payer: Self-pay | Admitting: Radiology

## 2017-09-02 DIAGNOSIS — G9340 Encephalopathy, unspecified: Secondary | ICD-10-CM | POA: Insufficient documentation

## 2017-09-02 LAB — VITAMIN D 25 HYDROXY (VIT D DEFICIENCY, FRACTURES): Vit D, 25-Hydroxy: 25.2 ng/mL — ABNORMAL LOW (ref 30.0–100.0)

## 2017-09-02 LAB — BASIC METABOLIC PANEL
ANION GAP: 8 (ref 5–15)
BUN: 12 mg/dL (ref 6–20)
CALCIUM: 8.7 mg/dL — AB (ref 8.9–10.3)
CHLORIDE: 107 mmol/L (ref 101–111)
CO2: 24 mmol/L (ref 22–32)
CREATININE: 0.82 mg/dL (ref 0.44–1.00)
GFR calc non Af Amer: >60 mL/min (ref >60)
Glucose, Bld: 111 mg/dL — ABNORMAL HIGH (ref 65–99)
Potassium: 3.3 mmol/L — ABNORMAL LOW (ref 3.5–5.1)
Sodium: 139 mmol/L (ref 135–145)

## 2017-09-02 LAB — GLUCOSE, CAPILLARY: GLUCOSE-CAPILLARY: 131 mg/dL — AB (ref 65–99)

## 2017-09-02 MED ORDER — LORAZEPAM 2 MG/ML IJ SOLN
0.5000 mg | Freq: Once | INTRAMUSCULAR | Status: AC | PRN
  Administered 2017-09-03: 0.5 mg via INTRAVENOUS
  Filled 2017-09-02 (×2): qty 1

## 2017-09-02 MED ORDER — IOPAMIDOL (ISOVUE-370) INJECTION 76%
INTRAVENOUS | Status: AC
  Administered 2017-09-02: 50 mL
  Filled 2017-09-02: qty 50

## 2017-09-02 MED ORDER — LEVETIRACETAM 500 MG/5ML IV SOLN
500.0000 mg | Freq: Once | INTRAVENOUS | Status: AC
  Administered 2017-09-02: 500 mg via INTRAVENOUS
  Filled 2017-09-02: qty 5

## 2017-09-02 NOTE — Progress Notes (Signed)
OT PT REQUESTED ASSISTANCE WITH PATIENT IN BR, ON ARRIVAL PT WITH FIXED STARE, UNRESPONSIVE, PALOR, ASSISTED BACK TO BED 3 ASSIST. VSS, RAPID RESPONSE CALLED. MD NOTIFIED. PT SLOWLY REGAINED RESPONSIVENESS, DIMINISHED FROM EARLIER BASELINE, EQUAL STRENGTH BIL UE, SYMMETRICAL APPEARANCE. RAPID RESPONSE RN ARRIVED, CODE STROKE CALLED, DR Leonel Ramsay ARRIVED, PT TRANSPORTED TO CT PER BED. RETURNED FROM CT IN NO DISTRESS. NO FURTHER DEPARTURES FROM BASELINE NOTED, FAMILY AT BEDSIDE, REPORTING THAT PT IS COMPLETELY AT BASELINE FROM PAST 2 YEARS.

## 2017-09-02 NOTE — Therapy (Signed)
Occupational Therapy Treatment Patient Details Name: Cynthia Massey MRN: [id number] DOB: 27-Jan-1946 Today's Date: 09/02/2017    History of present illness Pt is 71 y.o. female who was admitted to [facility] on 08/29/17 with LOC and slump to the right side. Pt has fallen twice in the last week and is being treated for 2 broken ribs on the left. PMH: Progressive dementia, breast cancer with bilateral mastectomy in 2001. Pt was found to have metastasis to cerebellum in 2004 and underwent whole brain radiation. MRI brain performed, no acute stroke.    OT comments  Pt completed functional mobility with min assist +2 for physical assistance with a chair follow. Pt alert and interacting with therapist demonstrating baseline confusion but was pleasant and joking with staff. Therapist assisting with peri care after BM and pt became unresponsive while sitting on 3 in 1. Nursing immediately altered and asked to call rapid response while therapists lifted and transferred pt to recliner. Pt reclined in chair and began to demonstrate increased responsiveness. Total lift + 4 to transfer pt to bed. OT spoke to rapid response nurse over phone to describe pt's current status. Nursing and rapid response assessing pt.   Pt will require 24 hour care and assistance upon d/c. If spouse is unable to provide this level of care, pt would benefit from SNF to increase independence and return to PLOF. Will further assess appropriate d/c plan. Feel husband/pt would benefit from palliative care consult.    Follow Up Recommendations  SNF;Supervision/Assistance - 24 hour    Equipment Recommendations  None recommended by OT    Recommendations for Other Services Other (comment) (Palliative care consult)    Precautions / Restrictions Precautions Precautions: Fall Restrictions Weight Bearing Restrictions: No       Mobility Bed Mobility Overal bed mobility: Needs Assistance Bed Mobility: Supine to Sit     Supine to sit: Min  assist     General bed mobility comments: Pt sitting EOB with PT upon arrival  Transfers Overall transfer level: Needs assistance Equipment used: 2 person hand held assist Transfers: Sit to/from Stand Sit to Stand: Min assist;+2 safety/equipment         General transfer comment: Patient sat back at EOB after first Sit to stand due to stated knee pain. Patient with a total of 3 sit to stand throughout session. Required VC's to reach for chair before sitting.     Balance Overall balance assessment: Needs assistance;History of Falls Sitting-balance support: Feet supported Sitting balance-Leahy Scale: Fair Sitting balance - Comments: pt able to sit EOB with supervision for safety   Standing balance support: Bilateral upper extremity supported;During functional activity Standing balance-Leahy Scale: Poor Standing balance comment: Pt requiring +2 HHA to maintain balance and mod A to correct LOB during mobility.                           ADL either performed or assessed with clinical judgement   ADL Overall ADL's : Needs assistance/impaired                         Toilet Transfer: +2 for physical assistance;Ambulation;Cueing for sequencing;Cueing for safety (bilateral UE support )   Toileting- Clothing Manipulation and Hygiene: +2 for physical assistance;Sit to/from stand;Cueing for sequencing       Functional mobility during ADLs: Minimal assistance;+2 for physical assistance (bilateral UE support) General ADL Comments: Pt requiring physical A for balance and safety during  ADLs and funcitonal mobility. Pt requiring Max cues throughout session to complete ADLs.      Vision       Perception     Praxis      Cognition Arousal/Alertness: Awake/alert Behavior During Therapy: WFL for tasks assessed/performed Overall Cognitive Status: History of cognitive impairments - at baseline                                 General Comments: Consistent  verbal cueing needed to complete simple ADL tasks.         Exercises     Shoulder Instructions       General Comments Pt alert and interacting with therapist demonstrating baseline confusion but was pleasant and joking with staff. Therapist assisting with peri care after BM and pt became unresponsive while sitting on 3 in 1. Nursing immediately altered and asked to call rapid response while therapists lifted and transferred pt to recliner. Pt reclined in chair and began to demonstrate increased responsiveness. Total lift + 4 to transfer pt to bed. OT spoke to rapid response nurse over phone to describe pt's current status. Nursing and rapid response assessing pt.      Pertinent Vitals/ Pain       Pain Assessment: 0-10 Faces Pain Scale: Hurts whole lot Pain Location: Head ache, B hips, knees, and back.  Pain Descriptors / Indicators: Aching;Grimacing;Tender Pain Intervention(s): Limited activity within patient's tolerance;Monitored during session  Home Living                                          Prior Functioning/Environment              Frequency  Min 2X/week        Progress Toward Goals  OT Goals(current goals can now be found in the care plan section)  Progress towards OT goals: Not progressing toward goals - comment (medical complications)  Acute Rehab OT Goals Patient Stated Goal: To feel better OT Goal Formulation: With patient Time For Goal Achievement: 09/14/17 Potential to Achieve Goals: Good  Plan Discharge plan needs to be updated    Co-evaluation      Reason for Co-Treatment: Complexity of the patient's impairments (multi-system involvement) PT goals addressed during session: Mobility/safety with mobility OT goals addressed during session: ADL's and self-care      AM-PAC PT "6 Clicks" Daily Activity     Outcome Measure   Help from another person eating meals?: A Little Help from another person taking care of personal  grooming?: A Lot Help from another person toileting, which includes using toliet, bedpan, or urinal?: A Little Help from another person bathing (including washing, rinsing, drying)?: A Lot Help from another person to put on and taking off regular upper body clothing?: A Little Help from another person to put on and taking off regular lower body clothing?: A Lot 6 Click Score: 15    End of Session Equipment Utilized During Treatment: Gait belt  OT Visit Diagnosis: Unsteadiness on feet (R26.81);Other abnormalities of gait and mobility (R26.89);Muscle weakness (generalized) (M62.81);History of falling (Z91.81);Pain;Other symptoms and signs involving cognitive function Pain - Right/Left:  (Bilateral) Pain - part of body: Shoulder;Hip;Knee (Headache)   Activity Tolerance Treatment limited secondary to medical complications (Comment)   Patient Left with bed alarm set;with nursing/sitter in room;with family/visitor present (  Rapid response nurse in room. )   Nurse Communication          Time: 5427-0623 OT Time Calculation (min): 35 min  Charges:    Boykin Peek, OTS [phone]    Boykin Peek 09/02/2017, 11:35 AM

## 2017-09-02 NOTE — Significant Event (Signed)
Rapid Response Event Note  Overview:  Called by RN for assistance  Time Called: 1033 Arrival Time: 1040 Event Type: Neurologic  Initial Focused Assessment:  Called by Rn for assistance with patient who was in bathroom having a BM with PT  And suddenly became unresponsive.  On my arrival to patients room, Rn and PT at bedside.  Patient was assisted back to bed prior to my arrival.  Patient lying in bed with nasal cannula on.     Interventions:  On my assessment, patient noted to have garbled speech, left facial droop and weakness and inability to follow commands.  As per Pt patient was LSN [id number], walking the halls with assistance and then in bathroom.  Code Stroke called at 1045.  Dr. Leonel Ramsay called and to bedside.  Patients husband arrived to bedside, spoke and updated husband on current event.  Patient transported to Ct scan via bed with monitor and oxygen.  CTA done Patient  Symptoms started to improve while in CT scan.    Plan of Care (if not transferred):   RN to monitor   Event Summary:  Patient to remain on floor and Rn to call if assistance needed   at      at          [facility], Harlin Rain

## 2017-09-02 NOTE — Progress Notes (Signed)
CSW acknowledging consult for skilled nursing placement; however, PT is recommending home with home health at this time. CSW not needed for skilled nursing placement.  CSW signing off.  Laveda Abbe, [place] Clinical Social Worker [phone]

## 2017-09-02 NOTE — Progress Notes (Signed)
Bedside EEG completed, results pending. 

## 2017-09-02 NOTE — NC FL2 (Signed)
[place] LEVEL OF CARE SCREENING TOOL     IDENTIFICATION  Patient Name: Cynthia Massey Birthdate: [birth year] Sex: female Admission Date (Current Location): 08/29/2017  [place] and [place] Number:   [facility])   Facility and Address:  [facility], [place] [address], [place], [place] [address]      Provider Number: [id number]  Attending Physician Name and Address:  McDiarmid, Blane Ohara, MD  Relative Name and Phone Number:       Current Level of Care: Hospital Recommended Level of Care: [place] Prior Approval Number:    Date Approved/Denied:   PASRR Number: [id number]  Discharge Plan: SNF    Current Diagnoses: Patient Active Problem List   Diagnosis Date Noted  . Pressure injury of skin 08/31/2017  . 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 Stroke ([place]) 08/30/2017  . Mental status alteration 08/30/2017  . Dementia 08/30/2017  . Falls frequently 08/30/2017  . Left rib fracture 08/30/2017  . Abnormality of gait due to impairment of balance 08/30/2017  . Encephalomalacia on imaging study 08/30/2017  . History of breast cancer 08/30/2017  . Vertebral fracture, osteoporotic ([place]) 08/30/2017  . Osteoporosis 08/30/2017  . History of external beam radiation therapy 12/10/2002    Orientation RESPIRATION BLADDER Height & Weight     Self  Normal Incontinent, External catheter Weight: 154 lb 12.2 oz (70.2 kg) Height:  5\' 7"  (170.2 cm)  BEHAVIORAL SYMPTOMS/MOOD NEUROLOGICAL BOWEL NUTRITION STATUS      Continent Diet (see DC summary)  AMBULATORY STATUS COMMUNICATION OF NEEDS Skin   Limited Assist Verbally PU Stage and Appropriate Care PU Stage 1 Dressing:  (on buttocks)                     Personal Care Assistance Level of Assistance  Bathing, Dressing Bathing Assistance: Limited assistance   Dressing Assistance: Limited assistance     Functional Limitations Info             SPECIAL CARE FACTORS FREQUENCY  PT  (By licensed PT), OT (By licensed OT)     PT Frequency: 5/wk OT Frequency: 5/wk            Contractures      Additional Factors Info  Code Status, Allergies Code Status Info: FULL Allergies Info: Morphine And Related           Current Medications (09/02/2017):  This is the current hospital active medication list Current Facility-Administered Medications  Medication Dose Route Frequency Provider Last Rate Last Dose  . acetaminophen (TYLENOL) tablet 500 mg  500 mg Oral Q6H PRN Sela Hilding, MD   500 mg at 09/01/17 1230  . atorvastatin (LIPITOR) tablet 20 mg  20 mg Oral q1800 Mayo, Pete Pelt, MD   20 mg at 09/01/17 1754  . cephALEXin (KEFLEX) capsule 250 mg  250 mg Oral Q6H Mayo, Pete Pelt, MD   250 mg at 09/02/17 1238  . enoxaparin (LOVENOX) injection 40 mg  40 mg Subcutaneous Q24H Diallo, Abdoulaye, MD   40 mg at 09/02/17 1239  . levETIRAcetam (KEPPRA) tablet 500 mg  500 mg Oral BID McDiarmid, Blane Ohara, MD   500 mg at 09/02/17 1239  . lidocaine (LIDODERM) 5 % 1 patch  1 patch Transdermal Q24H Diallo, Abdoulaye, MD   1 patch at 09/02/17 0245  . LORazepam (ATIVAN) injection 0.5 mg  0.5 mg Intravenous Once PRN Smiley Houseman, MD  Discharge Medications: Please see discharge summary for a list of discharge medications.  Relevant Imaging Results:  Relevant Lab Results:   Additional Information SS#: [id number]  Jorge Ny, LCSW

## 2017-09-02 NOTE — Procedures (Signed)
ELECTROENCEPHALOGRAM REPORT  Date of Study: 09/02/2017  Patient's Name: Cynthia Massey MRN: [id number] Date of Birth: [birth year]  Referring Provider: Dr. Roland Rack  Clinical History: This is a 71 year old woman with acute mental status changes with speech arrest and blank stare.  Medications: levETIRAcetam (KEPPRA) tablet 500 mg  acetaminophen (TYLENOL) tablet 500 mg  atorvastatin (LIPITOR) tablet 20 mg  cephALEXin (KEFLEX) capsule 250 mg  enoxaparin (LOVENOX) injection 40 mg  lidocaine (LIDODERM) 5 % 1 patch  LORazepam (ATIVAN) injection 0.5 mg   Technical Summary: A multichannel digital EEG recording measured by the international 10-20 system with electrodes applied with paste and impedances below 5000 ohms performed as portable with EKG monitoring in an awake and drowsy patient.  Hyperventilation and photic stimulation were not performed.  The digital EEG was referentially recorded, reformatted, and digitally filtered in a variety of bipolar and referential montages for optimal display.   Description: The patient is awake and drowsy during the recording.  During maximal wakefulness, there is a symmetric, medium voltage 6.5 Hz posterior dominant rhythm that poorly attenuates with eye opening and eye closure. There is diffuse background slowing with 4-5 Hz theta activity admixed with diffuse beta activity. There is additional polymorphic higher amplitude theta and delta slowing over the right hemisphere. Normal sleep architecture is not seen. Hyperventilation and photic stimulation were not performed.  There were no epileptiform discharges or electrographic seizures seen.    EKG lead was unremarkable.  Impression: This awake and drowsy EEG is abnormal due to the presence of: 1. Moderate diffuse slowing of the waking background 2. Additional focal slowing over the right hemisphere  Clinical Correlation of the above findings indicates diffuse cerebral dysfunction that is  non-specific in etiology and can be seen with hypoxic/ischemic injury, toxic/metabolic encephalopathies, or medication effect. Focal slowing over the right hemisphere indicates focal cerebral dysfunction in this region suggestive of underlying structural or physiologic abnormality. The absence of epileptiform discharges does not rule out a clinical diagnosis of epilepsy.  Clinical correlation is advised.   Ellouise Newer, M.D.

## 2017-09-02 NOTE — Progress Notes (Signed)
Subjective: I was called due to an acute mental status change.   The patient worked with physical therapy earlier, and following with physical therapy and working with occupational therapy. She then developed acute mental status change with cessation of verbal output and "blank stare." There is also concern for right facial droop, though this was documented in previous note by Dr. Rory Percy.  Exam: Vitals:   09/02/17 0615 09/02/17 1058  BP: 136/64 (!) 111/53  Pulse: 66 78  Resp: 16   Temp: 98.7 F (37.1 C)   SpO2: 97%    Gen/constitutional: In bed, NAD, thin Resp: non-labored breathing, no acute distress Abd: soft, nt Skin: No clear lesions HEENT: Normocephalic Psych: Affect appropriate to situation CV: Regular rate and rhythm  Neuro: MS: Initially he is able to tell me her name but very slow speech, subsequently begins following commands and answering simple questions, though still appears confused. CN: Mild right facial weakness complex to threat bilaterally, pupils are coronary life, cross midline bilaterally both directions. Motor: She moves all extremities to command with symmetric-appearing strength, but does not cooperate with full strength testing Sensory: Endorses symmetric sensation  Pertinent Labs: BMP-unremarkable  CT head - reviewed, no enhancing lesions.   Impression: 71 year old female with abrupt mental status change similar to the episode that caused her initial presentation. My suspicion is that she did have a partial seizure, though I would recommend checking orthostatics as well given that she was upright when she had the change.  Recommendations: 1) increase Keppra 1 g twice a day, additional 500 mg now 2) Agree with MRI brain to look for small lesions, but CT without clear evidence.  3) repeat EEG  4) will follow.   Roland Rack, MD [facility] [phone]  If 7pm- 7am, please page neurology on call as listed in [place].
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
## 2017-09-02 NOTE — Progress Notes (Signed)
OT Note - Addendum    09/02/17 1143  OT Visit Information  Last OT Received On 09/02/17  OT Time Calculation  OT Start Time (ACUTE ONLY) 0950  OT Stop Time (ACUTE ONLY) 1025  OT Time Calculation (min) 35 min  OT General Charges  $OT Visit 1 Visit  OT Treatments  $Self Care/Home Management  23-37 mins  [facility], OT/L  [phone] 09/02/2017

## 2017-09-02 NOTE — Progress Notes (Signed)
Physical Therapy Treatment Patient Details Name: Cynthia Massey MRN: [id number] DOB: 1946-10-25 Today's Date: 09/02/2017    History of Present Illness Pt is a 71 y.o. female presenting with LOC and slump to the right side. 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 MRI brain performed, no acute stroke.     PT Comments    Patient with dementia at baseline and difficulty following direction. Patient able to ambulate a total of 75 feet +2 min assist and mod assist required for LOB's. Patient requiring multimodal cueing for bed mobility and ambulation due to decreased cognition. Patient will continue to benefit from PT to increase independence in mobility. PT will follow to maximize mobility.    Follow Up Recommendations  Home health PT;Supervision/Assistance - 24 hour     Equipment Recommendations  None recommended by PT    Recommendations for Other Services       Precautions / Restrictions Precautions Precautions: Fall Restrictions Weight Bearing Restrictions: No    Mobility  Bed Mobility Overal bed mobility: Needs Assistance Bed Mobility: Supine to Sit     Supine to sit: Min assist     General bed mobility comments: Pt sitting EOB with PT upon arrival  Transfers Overall transfer level: Needs assistance Equipment used: 2 person hand held assist Transfers: Sit to/from Stand Sit to Stand: Min assist;+2 safety/equipment         General transfer comment: Patient sat back at EOB after first Sit to stand due to stated knee pain. Patient with a total of 3 sit to stand throughout session. Required VC's to reach for chair before sitting.   Ambulation/Gait Ambulation/Gait assistance: +2 safety/equipment;Min assist;Mod assist Ambulation Distance (Feet): 75 Feet (Sitting break in between.) Assistive device: 2 person hand held assist Gait  Pattern/deviations: Decreased step length - right;Decreased step length - left;Step-through pattern;Shuffle;Decreased stride length;Trunk flexed;Narrow base of support Gait velocity: decreased Gait velocity interpretation: Below normal speed for age/gender General Gait Details: Patient required VC's for wider BOS and increased step length. Patient required mod assist for intermittent losses of balance during gait. Patient required consistant +2 HHA throughout gait to ensure safety.   Stairs            Wheelchair Mobility    Modified Rankin (Stroke Patients Only)       Balance Overall balance assessment: Needs assistance;History of Falls Sitting-balance support: Feet supported Sitting balance-Leahy Scale: Fair Sitting balance - Comments: pt able to sit EOB with supervision for safety   Standing balance support: Bilateral upper extremity supported;During functional activity Standing balance-Leahy Scale: Poor Standing balance comment: Pt requiring +2 HHA to maintain balance and mod A to correct LOB during mobility.                            Cognition Arousal/Alertness: Awake/alert Behavior During Therapy: WFL for tasks assessed/performed Overall Cognitive Status: History of cognitive impairments - at baseline                                 General Comments: Consistent verbal cueing needed to complete simple ADL tasks.       Exercises      General Comments General comments (skin integrity, edema, etc.): Bruise on top of head from fall last night. Patient reports no new pain or  dizziness from fall.      Pertinent Vitals/Pain Pain Assessment: 0-10 Faces Pain Scale: Hurts whole lot Pain Location: Head ache, B hips, knees, and back.  Pain Descriptors / Indicators: Aching;Grimacing;Tender Pain Intervention(s): Limited activity within patient's tolerance;Monitored during session    Home Living                      Prior Function             PT Goals (current goals can now be found in the care plan section) Acute Rehab PT Goals Patient Stated Goal: return home and start therapy PT Goal Formulation: With family Time For Goal Achievement: 09/14/17 Potential to Achieve Goals: Good Progress towards PT goals: Progressing toward goals    Frequency    Min 3X/week      PT Plan Current plan remains appropriate    Co-evaluation PT/OT/SLP Co-Evaluation/Treatment: Yes Reason for Co-Treatment: Complexity of the patient's impairments (multi-system involvement) PT goals addressed during session: Mobility/safety with mobility OT goals addressed during session: ADL's and self-care      AM-PAC PT "6 Clicks" Daily Activity  Outcome Measure  Difficulty turning over in bed (including adjusting bedclothes, sheets and blankets)?: A Lot Difficulty moving from lying on back to sitting on the side of the bed? : Unable Difficulty sitting down on and standing up from a chair with arms (e.g., wheelchair, bedside commode, etc,.)?: Unable Help needed moving to and from a bed to chair (including a wheelchair)?: A Lot Help needed walking in hospital room?: A Lot Help needed climbing 3-5 steps with a railing? : A Lot 6 Click Score: 10    End of Session Equipment Utilized During Treatment: Gait belt Activity Tolerance: Patient limited by pain Patient left: Other (comment) (With OT on toilet.) Nurse Communication: Mobility status PT Visit Diagnosis: Other abnormalities of gait and mobility (R26.89)     Time: 1610-9604 PT Time Calculation (min) (ACUTE ONLY): 30 min  Charges:  $Gait Training: 23-37 mins                    G Codes:       Dulcemaria Bula SPT   Monette Omara 09/02/2017, 11:27 AM

## 2017-09-02 NOTE — Progress Notes (Signed)
Family Medicine Teaching Service Daily Progress Note Intern Pager: [phone]  Patient name: Cynthia Massey Medical record number: [id number] Date of birth: 31-Dec-1945 Age: 71 y.o. Gender: female  Primary Care Provider: System, Pcp Not In Consultants: Neurology Code Status: Full  Pt Overview and Major Events to Date:  9/21: Admitted for LOC and AMS  Assessment and Plan: Cynthia Massey is a 71 y.o. female presenting with LOC and slump to the right side. 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  LOC with AMS due to seizure vs stroke: At baseline per family. EEG with diffuse cerebral dysfunction. MRI without any obvious acute abnormalities, but poor imaging study due to patient movement. - Neurology has signed off, appreciate recommendations. Continue Keppra 500mg  bid and needs MRI brain w/ and w/o contrast to evaluate for metastatic disease. - Can d/c Telemetry, continuous pulse ox - ECHO pending and carotid US preliminary report with no significant stenosis noted - PT/ OT/ SLP to evaluate and treat - Vitals per floor - Neuro checks q2 hours for 12 hours  UTI:  Will treat for UTI. - Continue Keflex, started (9/22- ) for 7 day treatment, end date (9/29) - Urine culture with E. Coli  Recent fractured ribs due to falls: Patient with 2 falls in the last week. CT head negative for intracranial bleed. - Lidocaine patch - Tylenol prn - Incentive spirometry ordered  Hypokalemia: Resolved. K 4.2 on 09/23. - Monitor  H/o of breast cancer with metastatic brain tumor to the R cerebellum: Patient has a history of breast cancer with bilateral mastectomy in 2001. Patient was found to have metastasis to cerebellum in 2004 and underwent whole brain radiation.  HLD: Stable. On Lipitor 20 mg at home. - Restart home Lipitor 20mg  daily  FEN/GI: clear  liquids Prophylaxis: Lovenox  Disposition: Discharge home today with [facility] in [place]  Subjective:  Patient states she feels fine this morning. She says she has no pain. Her husband is requesting SNF placement near [place].   Objective: Temp:  [97.6 F (36.4 C)-98.7 F (37.1 C)] 98.7 F (37.1 C) (09/24 0615) Pulse Rate:  [66-73] 66 (09/24 0615) Resp:  [16] 16 (09/24 0615) BP: (122-149)/(60-77) 136/64 (09/24 0615) SpO2:  [96 %-99 %] 97 % (09/24 0615) Physical Exam: General: Awake, alert, in NAD Cardiovascular: RRR Respiratory: CTAB, normal work of breathing Gastrointestinal: soft, nondistended, normoactive BS, no suprapubic tenderness Extremities: no LE edema noted Neuro: Answers questions appropriately. Alert to self and place only. Trouble with memory.  Laboratory:  Recent Labs Lab 08/26/17 1410 08/28/17 0942 08/29/17 2151 08/29/17 2156  WBC 8.9 6.7 9.1  --   HGB 13.2 12.0 12.0 12.2  HCT 40.2 36.4 37.7 36.0  PLT 296 269 266  --     Recent Labs Lab 08/26/17 1410 08/28/17 0942 08/29/17 2151 08/29/17 2156 08/31/17 1241 09/01/17 0652  NA 141 138 136 139 137 139  K 3.5 4.0 3.4* 3.6 4.2 3.1*  CL 108 107 106 103 108 108  CO2 26 26 25   --  22 25  BUN 16 18 18 20 11 11   CREATININE 0.71 0.74 0.78 0.70 0.63 0.67  CALCIUM 8.9 9.2 8.8*  --  8.9 8.9  PROT 7.2 6.8 6.3*  --   --   --   BILITOT 0.8 0.6 0.6  --   --   --   ALKPHOS 73 75 100  --   --   --  ALT 19 18 51  --   --   --   AST 22 21 46*  --   --   --   GLUCOSE 95 110* 129* 126* 101* 95   tot chol 140, TG 101, HDL 50, LDL 70 Hbg A1c 5.3  Imaging/Diagnostic Tests: Dg Chest 2 View  Result Date: 08/30/2017 CLINICAL DATA:  Chest pain EXAM: CHEST  2 VIEW COMPARISON:  08/28/2017, 08/26/2017 FINDINGS: Low lung volumes. No acute consolidation or pleural effusion. Borderline cardiomegaly, augmented by low lung volume. No pneumothorax. Surgical clips in the left upper quadrant and right chest. Compression  deformity at T12. IMPRESSION: Low lung volumes with borderline cardiomegaly. No infiltrate or edema Electronically Signed   By: Donavan Foil M.D.   On: 08/30/2017 02:30   Mr Brain Wo Contrast  Result Date: 08/30/2017 CLINICAL DATA:  Loss of consciousness.  Seizure. EXAM: MRI HEAD WITHOUT CONTRAST TECHNIQUE: Multiplanar, multiecho pulse sequences of the brain and surrounding structures were obtained without intravenous contrast. COMPARISON:  Head CT 08/29/2017 FINDINGS: The study is degraded by motion, despite efforts to reduce this artifact, including utilization of motion-resistant MR sequences. The findings of the study are interpreted in the context of reduced sensitivity/specificity. Brain: The midline structures are normal. There is no focal diffusion restriction to indicate acute infarct. Old right cerebellar infarct. There is diffuse confluent hyperintense T2-weighted signal within the periventricular, deep and juxtacortical white matter, most often seen in the setting of chronic microvascular ischemia. No intraparenchymal hematoma or chronic microhemorrhage. There is diffuse volume loss. The dura is normal and there is no extra-axial collection. Vascular: Major intracranial arterial and venous sinus flow voids are preserved. Skull and upper cervical spine: The visualized skull base, calvarium, upper cervical spine and extracranial soft tissues are normal. Sinuses/Orbits: No fluid levels or advanced mucosal thickening. No mastoid or middle ear effusion. Normal orbits. IMPRESSION: 1. Motion degraded examination. Within that limitation, no visualized acute abnormality. 2. Advanced volume loss and chronic ischemic microangiopathy. Electronically Signed   By: Ulyses Jarred M.D.   On: 08/30/2017 02:09   Ct Head Code Stroke Wo Contrast  Result Date: 08/29/2017 CLINICAL DATA:  Code stroke. Initial evaluation for acute right-sided facial droop. EXAM: CT HEAD WITHOUT CONTRAST TECHNIQUE: Contiguous axial  images were obtained from the base of the skull through the vertex without intravenous contrast. COMPARISON:  Prior CT from 08/26/2017. FINDINGS: Brain: Stable atrophy with advanced cerebral white matter changes. Encephalomalacia out within the right cerebellar hemisphere, unchanged. No acute intracranial hemorrhage. No evidence for acute large vessel territory infarct. No mass lesion, midline shift or mass effect. Ventricular prominence related underlying atrophy without hydrocephalus. No extra-axial fluid collection. Vascular: No hyperdense vessel. Scattered vascular calcifications noted within the carotid siphons. Skull: Scalp soft tissues demonstrate no acute abnormality. Post craniotomy changes from remote right occipital craniotomy again noted. Sinuses/Orbits: Globes oral soft tissues within normal limits. Paranasal sinuses are clear. No mastoid effusion. Other: None. ASPECTS [facility] Stroke Program Early CT Score) - Ganglionic level infarction (caudate, lentiform nuclei, internal capsule, insula, M1-M3 cortex): 7 - Supraganglionic infarction (M4-M6 cortex): 3 Total score (0-10 with 10 being normal): 10 IMPRESSION: 1. No acute intracranial infarct or other process identified. 2. ASPECTS is 10 3. Stable atrophy with advanced cerebral white matter hypoattenuation. 4. Sequelae of previous right occipital craniotomy with underlying right cerebellar encephalomalacia, stable. Critical Value/emergent results were called by telephone at the time of interpretation on 08/29/2017 at 10:20 pm to Dr. Nicole Kindred , who verbally acknowledged these  results. Electronically Signed   By: Jeannine Boga M.D.   On: 08/29/2017 22:24    Kupono Marling, [place], DO 09/02/2017, 9:28 AM PGY-1, [place] Intern pager: [phone], text pages welcome

## 2017-09-03 ENCOUNTER — Inpatient Hospital Stay (HOSPITAL_COMMUNITY): Payer: Medicare Other

## 2017-09-03 LAB — URINE CULTURE: Culture: >=100000 — AB

## 2017-09-03 MED ORDER — GADOBENATE DIMEGLUMINE 529 MG/ML IV SOLN
15.0000 mL | Freq: Once | INTRAVENOUS | Status: AC
  Administered 2017-09-03: 10 mL via INTRAVENOUS

## 2017-09-03 MED ORDER — LEVETIRACETAM 500 MG PO TABS: 1000.0000 mg | ORAL_TABLET | Freq: Two times a day (BID) | ORAL | 0 refills | Status: AC

## 2017-09-03 MED ORDER — CEPHALEXIN 250 MG PO CAPS: 250.0000 mg | ORAL_CAPSULE | Freq: Four times a day (QID) | ORAL | 0 refills | Status: AC

## 2017-09-03 NOTE — Progress Notes (Signed)
Pt being discharged to [facility] rehab per orders from MD. Pt and family informed of transfer. Pt's IV was removed prior to discharge. All questions and concerns were addressed to best of RN's ability. Pt exited hospital via stretcher.

## 2017-09-03 NOTE — Progress Notes (Signed)
Pt off unit to MRI.

## 2017-09-03 NOTE — Care Management Note (Signed)
Case Management Note  Patient Details  Name: Cynthia Massey MRN: [id number] Date of Birth: 12/07/1946  Subjective/Objective:                    Action/Plan: Pt discharging to SNF today. No further needs per CM.   Expected Discharge Date:  09/03/17               Expected Discharge Plan:  Skilled Nursing Facility  In-House Referral:  Clinical Social Work  Discharge planning Services     Post Acute Care Choice:    Choice offered to:     DME Arranged:    DME Agency:     HH Arranged:    [place] Agency:     Status of Service:  Completed, signed off  If discussed at [employer], dates discussed:    Additional Comments:  Pollie Friar, RN 09/03/2017, 3:05 PM

## 2017-09-03 NOTE — Progress Notes (Signed)
Family Medicine Teaching Service Daily Progress Note Intern Pager: [phone]  Patient name: Cynthia Massey Medical record number: [id number] Date of birth: 1946-06-02 Age: 71 y.o. Gender: female  Primary Care Provider: System, Pcp Not In Consultants: Neurology Code Status: Full  Pt Overview and Major Events to Date:  9/21: Admitted for LOC and AMS  Assessment and Plan: Soraiya Ahner is a 71 y.o. female presenting with LOC and slump to the right side. 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  LOC with AMS due to seizure vs stroke: At baseline per family. EEG with diffuse cerebral dysfunction. MRI without any obvious acute abnormalities, but poor imaging study due to patient movement. MRI w/ and w/o: No acute abnormality. Severe chronic ischemic microangiopathy, age advanced volume loss and old right cerebellar infarct. Few scattered, peripheral predominant foci of chronic microhemorrhage. This may indicate underlying cerebral amyloid angiopathy. - Neurology consulted, appreciate recommendations.  - Continue Keppra 1000mg  bid. - Continuous pulse ox - Can d/c telemetry - EEG unchanged - PT/ OT/ SLP to evaluate and treat - Vitals per floor - Neuro checks q2 hours for 12 hours  UTI:  Will treat for UTI. - Continue Keflex, started (9/22- ) for 7 day treatment, end date (9/29) - Urine culture with E. Coli  Recent fractured ribs due to falls: Patient with 2 falls in the last week. CT head negative for intracranial bleed. - Lidocaine patch - Tylenol prn - Incentive spirometry ordered  Hypokalemia: Resolved. K 3.3 on 09/25. - will give 65 Kdur  H/o of breast cancer with metastatic brain tumor to the R cerebellum: Patient has a history of breast cancer with bilateral mastectomy in 2001. Patient was found to have metastasis to cerebellum in 2004 and  underwent whole brain radiation.  HLD: Stable. On Lipitor 20 mg at home. - Restart home Lipitor 20mg  daily  FEN/GI: clear liquids Prophylaxis: Lovenox  Disposition: Discharge home today with [facility] in [place]  Subjective:  Patient states she feels fine this morning. She says she has no pain. Her husband is requesting SNF.   Objective: Temp:  [97.7 F (36.5 C)-99.4 F (37.4 C)] 97.7 F (36.5 C) (09/25 0557) Pulse Rate:  [74-86] 74 (09/25 0050) Resp:  [18-20] 18 (09/25 0557) BP: (111-139)/(51-63) 129/60 (09/25 0557) SpO2:  [96 %-100 %] 96 % (09/25 0557) Physical Exam: General: Awake, alert, in NAD Cardiovascular: RRR Respiratory: CTAB, normal work of breathing Gastrointestinal: soft, nondistended, normoactive BS, no suprapubic tenderness Extremities: no LE edema noted Neuro: Answers questions appropriately. Alert to self and place only. Trouble with memory.  Laboratory:  Recent Labs Lab 08/28/17 0942 08/29/17 2151 08/29/17 2156  WBC 6.7 9.1  --   HGB 12.0 12.0 12.2  HCT 36.4 37.7 36.0  PLT 269 266  --     Recent Labs Lab 08/28/17 0942 08/29/17 2151  08/31/17 1241 09/01/17 0652 09/02/17 1127  NA 138 136  < > 137 139 139  K 4.0 3.4*  < > 4.2 3.1* 3.3*  CL 107 106  < > 108 108 107  CO2 26 25  --  22 25 24   BUN 18 18  < > 11 11 12   CREATININE 0.74 0.78  < > 0.63 0.67 0.82  CALCIUM 9.2 8.8*  --  8.9 8.9 8.7*  PROT 6.8 6.3*  --   --   --   --   BILITOT 0.6  0.6  --   --   --   --   ALKPHOS 75 100  --   --   --   --   ALT 18 51  --   --   --   --   AST 21 46*  --   --   --   --   GLUCOSE 110* 129*  < > 101* 95 111*  < > = values in this interval not displayed. tot chol 140, TG 101, HDL 50, LDL 70 Hbg A1c 5.3  Imaging/Diagnostic Tests: Dg Chest 2 View  Result Date: 08/30/2017 CLINICAL DATA:  Chest pain EXAM: CHEST  2 VIEW COMPARISON:  08/28/2017, 08/26/2017 FINDINGS: Low lung volumes. No acute consolidation or pleural effusion. Borderline cardiomegaly,  augmented by low lung volume. No pneumothorax. Surgical clips in the left upper quadrant and right chest. Compression deformity at T12. IMPRESSION: Low lung volumes with borderline cardiomegaly. No infiltrate or edema Electronically Signed   By: Donavan Foil M.D.   On: 08/30/2017 02:30   Mr Brain Wo Contrast  Result Date: 08/30/2017 CLINICAL DATA:  Loss of consciousness.  Seizure. EXAM: MRI HEAD WITHOUT CONTRAST TECHNIQUE: Multiplanar, multiecho pulse sequences of the brain and surrounding structures were obtained without intravenous contrast. COMPARISON:  Head CT 08/29/2017 FINDINGS: The study is degraded by motion, despite efforts to reduce this artifact, including utilization of motion-resistant MR sequences. The findings of the study are interpreted in the context of reduced sensitivity/specificity. Brain: The midline structures are normal. There is no focal diffusion restriction to indicate acute infarct. Old right cerebellar infarct. There is diffuse confluent hyperintense T2-weighted signal within the periventricular, deep and juxtacortical white matter, most often seen in the setting of chronic microvascular ischemia. No intraparenchymal hematoma or chronic microhemorrhage. There is diffuse volume loss. The dura is normal and there is no extra-axial collection. Vascular: Major intracranial arterial and venous sinus flow voids are preserved. Skull and upper cervical spine: The visualized skull base, calvarium, upper cervical spine and extracranial soft tissues are normal. Sinuses/Orbits: No fluid levels or advanced mucosal thickening. No mastoid or middle ear effusion. Normal orbits. IMPRESSION: 1. Motion degraded examination. Within that limitation, no visualized acute abnormality. 2. Advanced volume loss and chronic ischemic microangiopathy. Electronically Signed   By: Ulyses Jarred M.D.   On: 08/30/2017 02:09   Ct Head Code Stroke Wo Contrast  Result Date: 08/29/2017 CLINICAL DATA:  Code stroke.  Initial evaluation for acute right-sided facial droop. EXAM: CT HEAD WITHOUT CONTRAST TECHNIQUE: Contiguous axial images were obtained from the base of the skull through the vertex without intravenous contrast. COMPARISON:  Prior CT from 08/26/2017. FINDINGS: Brain: Stable atrophy with advanced cerebral white matter changes. Encephalomalacia out within the right cerebellar hemisphere, unchanged. No acute intracranial hemorrhage. No evidence for acute large vessel territory infarct. No mass lesion, midline shift or mass effect. Ventricular prominence related underlying atrophy without hydrocephalus. No extra-axial fluid collection. Vascular: No hyperdense vessel. Scattered vascular calcifications noted within the carotid siphons. Skull: Scalp soft tissues demonstrate no acute abnormality. Post craniotomy changes from remote right occipital craniotomy again noted. Sinuses/Orbits: Globes oral soft tissues within normal limits. Paranasal sinuses are clear. No mastoid effusion. Other: None. ASPECTS [facility] Stroke Program Early CT Score) - Ganglionic level infarction (caudate, lentiform nuclei, internal capsule, insula, M1-M3 cortex): 7 - Supraganglionic infarction (M4-M6 cortex): 3 Total score (0-10 with 10 being normal): 10 IMPRESSION: 1. No acute intracranial infarct or other process identified. 2. ASPECTS is 10 3. Stable atrophy  with advanced cerebral white matter hypoattenuation. 4. Sequelae of previous right occipital craniotomy with underlying right cerebellar encephalomalacia, stable. Critical Value/emergent results were called by telephone at the time of interpretation on 08/29/2017 at 10:20 pm to Dr. Nicole Kindred , who verbally acknowledged these results. Electronically Signed   By: Jeannine Boga M.D.   On: 08/29/2017 22:24    Veola Cafaro, [place], DO 09/03/2017, 9:33 AM PGY-1, [place] Intern pager: [phone], text pages welcome

## 2017-09-03 NOTE — Progress Notes (Signed)
Physical Therapy Treatment Patient Details Name: Cynthia Massey MRN: [id number] DOB: [birth year] Today's Date: 09/03/2017    History of Present Illness Pt is 71 y.o. female who was admitted to [facility] on 08/29/17 with LOC and slump to the right side. Pt has fallen twice in the last week and is being treated for 2 broken ribs on the left. PMH: Progressive dementia, breast cancer with bilateral mastectomy in 2001. Pt was found to have metastasis to cerebellum in 2004 and underwent whole brain radiation.     PT Comments    Focus of session was discharge planning and therapeutic exercise. Discussed with husband the option of rehab at a SNF level vs home with home health PT to follow.  Feel this patient could benefit more from continued rehab at the SNF level due to increased frequency of therapy, increased supervision, and the level of assist pt is currently requiring for OOB mobility. PT recommendations updated this session to SNF. Will continue to follow and progress as able per POC.   Follow Up Recommendations  SNF;Supervision/Assistance - 24 hour     Equipment Recommendations  None recommended by PT    Recommendations for Other Services       Precautions / Restrictions Precautions Precautions: Fall Restrictions Weight Bearing Restrictions: No    Mobility  Bed Mobility                  Transfers                    Ambulation/Gait                 Stairs            Wheelchair Mobility    Modified Rankin (Stroke Patients Only)       Balance                                            Cognition Arousal/Alertness: Awake/alert Behavior During Therapy: WFL for tasks assessed/performed Overall Cognitive Status: History of cognitive impairments - at baseline                                        Exercises Total Joint Exercises Bridges: 10 reps;Both General Exercises - Lower Extremity Ankle Circles/Pumps: 10  reps;Both Quad Sets: 10 reps;Both Hip ABduction/ADduction: 10 reps;Both Straight Leg Raises: 10 reps;Both    General Comments        Pertinent Vitals/Pain Pain Assessment: 0-10 Faces Pain Scale: Hurts whole lot Pain Location: Head ache, B hips, knees, and back.  Pain Descriptors / Indicators: Aching;Grimacing;Tender Pain Intervention(s): Limited activity within patient's tolerance;Monitored during session;Repositioned    Home Living                      Prior Function            PT Goals (current goals can now be found in the care plan section) Acute Rehab PT Goals Patient Stated Goal: To feel better PT Goal Formulation: With family Time For Goal Achievement: 09/14/17 Potential to Achieve Goals: Good Progress towards PT goals: Progressing toward goals    Frequency    Min 3X/week      PT Plan Discharge plan needs to be updated    Co-evaluation  AM-PAC PT "6 Clicks" Daily Activity  Outcome Measure  Difficulty turning over in bed (including adjusting bedclothes, sheets and blankets)?: A Lot Difficulty moving from lying on back to sitting on the side of the bed? : Unable Difficulty sitting down on and standing up from a chair with arms (e.g., wheelchair, bedside commode, etc,.)?: Unable Help needed moving to and from a bed to chair (including a wheelchair)?: A Lot Help needed walking in hospital room?: A Lot Help needed climbing 3-5 steps with a railing? : A Lot 6 Click Score: 10    End of Session   Activity Tolerance: Patient tolerated treatment well Patient left: in bed;with call bell/phone within reach;with bed alarm set Nurse Communication: Mobility status PT Visit Diagnosis: Other abnormalities of gait and mobility (R26.89)     Time: 2060-1561 PT Time Calculation (min) (ACUTE ONLY): 25 min  Charges:  $Therapeutic Exercise: 23-37 mins                    G Codes:       Rolinda Roan, PT, DPT Acute Rehabilitation  Services Pager: [place] 09/03/2017, 10:40 AM

## 2017-09-03 NOTE — Clinical Social Work Placement (Signed)
Nurse to call report to [phone], Room [place]  NOTE  Date:  09/03/2017  Patient Details  Name: Cynthia Massey MRN: [id number] Date of Birth: Dec 18, 1945  Clinical Social Work is seeking post-discharge placement for this patient at the [place] level of care (*CSW will initial, date and re-position this form in  chart as items are completed):  Yes   Patient/family provided with [facility] Work Department's list of facilities offering this level of care within the geographic area requested by the patient (or if unable, by the patient's family).  Yes   Patient/family informed of their freedom to choose among providers that offer the needed level of care, that participate in Medicare, Medicaid or managed care program needed by the patient, have an available bed and are willing to accept the patient.  Yes   Patient/family informed of [facility]'s ownership interest in [facility] and [facility], as well as of the fact that they are under no obligation to receive care at these facilities.  PASRR submitted to EDS on       PASRR number received on 09/02/17     Existing PASRR number confirmed on       FL2 transmitted to all facilities in geographic area requested by pt/family on 09/02/17     FL2 transmitted to all facilities within larger geographic area on 09/02/17     Patient informed that his/her managed care company has contracts with or will negotiate with certain facilities, including the following:        Yes   Patient/family informed of bed offers received.  Patient chooses bed at [facility]     Physician recommends and patient chooses bed at      Patient to be transferred to [facility] on 09/03/17.  Patient to be transferred to facility by [facility]     Patient family notified on 09/03/17 of transfer.  Name of family member notified:  Clair Gulling     PHYSICIAN        Additional Comment:    _______________________________________________ Geralynn Ochs, LCSW 09/03/2017, 3:42 PM

## 2017-09-03 NOTE — Progress Notes (Signed)
Subjective: Awake and alert to fact she is in a place where people are cared for. Unable to tell me it is a hospital, month, date. Able to follow commands. Able to name husband. In pain from broken ribs. 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   Exam: Vitals:   09/03/17 0050 09/03/17 0557  BP: 138/63 129/60  Pulse: 74   Resp: 18 18  Temp: 98.7 F (37.1 C) 97.7 F (36.5 C)  SpO2: 100% 96%    HEENT-  Normocephalic, no lesions, without obvious abnormality.  Normal external eye and conjunctiva.  Normal TM's bilaterally.  Normal auditory canals and external ears. Normal external nose, mucus membranes and septum.  Normal pharynx.    Neuro:  CN: Pupils are equal and round. They are symmetrically reactive from 3-->2 mm. EOMI without nystagmus. Facial sensation is intact to light touch. Face is right facial droop at rest with normal strength and mobility. Hearing is intact to conversational voice. Palate elevates symmetrically and uvula is midline. Voice is normal in tone, pitch and quality. Bilateral SCM and trapezii are 5/5. Tongue is midline with normal bulk and mobility.  Motor: able to lift all extremities antigravity but rib pain playing major issue  Sensation: Intact to light touch.  DTRs: 2+, symmetric  Toes downgoing bilaterally. No pathologic reflexes.      Pertinent Labs/Diagnostics: EEG Impression: This awake and drowsy EEG is abnormal due to the presence of: 1. Moderate diffuse slowing of the waking background 2. Additional focal slowing over the right hemisphere  Clinical Correlation of the above findings indicates diffuse cerebral dysfunction that is non-specific in etiology and can be seen with hypoxic/ischemic injury, toxic/metabolic encephalopathies, or medication effect. Focal slowing over the right hemisphere indicates focal cerebral dysfunction in this region suggestive of underlying structural or physiologic abnormality. The  absence of epileptiform discharges does not rule out a clinical diagnosis of epilepsy.  Clinical correlation is advised.   Etta Quill PA-C [facility] [phone]  I have seen and evaluated the patient. I have reviewed the above note.     Impression: 71 year old female with abrupt mental status change --likely partial seizures. No further events over night and tolerating Keppra well.    Recommendations: 1) Continue Keppra 2) No further recommendations at this time. Please call with further questions or concerns.   Roland Rack, MD [facility] [phone]  If 7pm- 7am, please page neurology on call as listed in [place].  09/03/2017, 9:47 AM

## 2017-09-04 ENCOUNTER — Non-Acute Institutional Stay (SKILLED_NURSING_FACILITY): Payer: Medicare Other | Admitting: Internal Medicine

## 2017-09-04 ENCOUNTER — Encounter: Payer: Self-pay | Admitting: Internal Medicine

## 2017-09-04 DIAGNOSIS — N390 Urinary tract infection, site not specified: Secondary | ICD-10-CM

## 2017-09-04 DIAGNOSIS — G9389 Other specified disorders of brain: Secondary | ICD-10-CM | POA: Diagnosis not present

## 2017-09-04 DIAGNOSIS — B9620 Unspecified Escherichia coli [E. coli] as the cause of diseases classified elsewhere: Secondary | ICD-10-CM | POA: Diagnosis not present

## 2017-09-04 DIAGNOSIS — E785 Hyperlipidemia, unspecified: Secondary | ICD-10-CM | POA: Diagnosis not present

## 2017-09-04 DIAGNOSIS — S2242XD Multiple fractures of ribs, left side, subsequent encounter for fracture with routine healing: Secondary | ICD-10-CM | POA: Diagnosis not present

## 2017-09-04 DIAGNOSIS — G40209 Localization-related (focal) (partial) symptomatic epilepsy and epileptic syndromes with complex partial seizures, not intractable, without status epilepticus: Secondary | ICD-10-CM

## 2017-09-04 DIAGNOSIS — C7931 Secondary malignant neoplasm of brain: Secondary | ICD-10-CM | POA: Diagnosis not present

## 2017-09-04 DIAGNOSIS — F0390 Unspecified dementia without behavioral disturbance: Secondary | ICD-10-CM | POA: Diagnosis not present

## 2017-09-04 DIAGNOSIS — G9340 Encephalopathy, unspecified: Secondary | ICD-10-CM | POA: Diagnosis not present

## 2017-09-04 HISTORY — DX: Secondary malignant neoplasm of brain: C79.31

## 2017-09-04 NOTE — Progress Notes (Signed)
: Provider:  Noah Delaine. Sheppard Coil, MD Location:  [facility] Room Number: 161 Place of Service:  SNF (31)  PCP: System, Pcp Not In Patient Care Team: System, Pcp Not In as PCP - General  Extended Emergency Contact Information Primary Emergency Contact: Paulsen,Jim Address: [address]          [place], [place] [address] Johnnette Litter of [place] Mobile Phone: [phone] Relation: Spouse Secondary Emergency Contact: Vernie Ammons [place] of [place] Mobile Phone: [phone] Relation: Daughter     Allergies: Morphine and related and Codeine  Chief Complaint  Patient presents with  . New Admit To SNF    following hospitalization 08/29/17 to 09/03/17 lost of consciousness with altered mental status    HPI: Patient is 71 y.o. 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 And have right-sided weakness. Code stroke was called on arrival to the ED. Neurology was consulted and CT head showed no acute intracranial infarction or other process identified. Quality of surgery was noted as expected. Neurology felt the patient was having a complex partial seizure with a postictal state afterwards as possible cause for decreased mentation. Patient was loaded on Keppra. Husband did report declining mental status for the past year and a half. Patient was admitted to [facility] from 9/20-25. During admission her urine grew out Escherichia coli, although she had no symptoms and she was started on Keflex for 7 day treatment to end on 9/29. While working with PT in the hospital she had another member where she appeared to have facial droop and weakness, code stroke was called again, and CT again showed no acute abnormality. Repeat EEG showed no new findings either. MRI no change from prior.  Patient is admitted to skilled nursing facility for generalized weakness for OT/PT. While at skilled nursing facility patient will be followed for   Past Medical History:  Diagnosis Date  . Abnormality of gait due to impairment of balance 08/30/2017  . Acute encephalopathy   . Aphasia 01/27/2017   Last Assessment & Plan:  Symptoms concerning for stroke or transient ischemic attack but patient has had aphasia and weakness with falls multiple times in the past and her stroke workup has been negative. She was not a candidate for tPA due to bleeding risk Monitoring:  Telemetry x 48 hours to evaluate for arrhythmias, Neuro checks q2hours x 24 hours, then q4hours. Imaging: CTA of the brain and neck, carotid dopplers, echocardiogram with bubble study  Treatment: ASA statin Consults: PT/OT/ST/Rehab hospital assessment and Neurology. Education: Stroke education plan and risk factor modification. Diet to advance as per speech and language evaluation. Maintain euglycemia and allow permissive hypertension DVT prophylaxis as per protocol  . Arthritis    "knees, lower back" (08/30/2017)  . Ataxia 08/19/2017  . Breast cancer metastasized to brain [facility]) 2004   S/P chemo and radiation  . Breast cancer, right breast ([place]) 2001  . Chronic lower back pain   . Dementia   . Encephalomalacia on imaging study 08/30/2017   Head CT 08/29/2017  . Falls frequently 08/30/2017  . GERD (gastroesophageal reflux disease)   . Headache    "almost daily" (08/30/2017)  . Heart murmur   . History of breast cancer 08/30/2017   Metastatic Breast cancer 2001.  Bilateral Mastectomy.   Marland Kitchen History of external beam radiation therapy 12/10/2002  Whole brain radiation 2004  . Hyperlipidemia   . Left rib fracture 08/30/2017  . 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 Osteoporosis 08/30/2017  . PONV (postoperative nausea and vomiting)   . Rib fractures 08/26/2017   "fell; broke 2 on the left"  . Secondary malignant neoplasm of brain ([place]) 09/04/2017  . Seizures ([place])    "we were told there was evidence of previous seizures last night in ER; & she had more last night" (08/30/2017)  . Stroke ([place]) 08/30/2017  . TIA (transient ischemic attack) 11/16/2015  . Vertebral fracture, osteoporotic ([place]) 08/30/2017    Past Surgical History:  Procedure Laterality Date  . ABDOMINAL HYSTERECTOMY  1985  . APPENDECTOMY    . BRAIN TUMOR EXCISION  2004   "got it all; still had to have chemo & full brain radiation"  . BREAST SURGERY Right ~ 2000  . CHOLECYSTECTOMY OPEN  ~ 2013  . MASTECTOMY Bilateral 2001   "prophylactic on left side; cancer on right side; went from breast to brain 4 years later"    Allergies as of 09/04/2017      Reactions   Morphine And Related Nausea And Vomiting   Codeine Nausea And Vomiting      Medication List       Accurate as of 09/04/17 11:59 PM. Always use your most recent med list.          acetaminophen 650 MG CR tablet Commonly known as:  TYLENOL 8 HOUR Take 1 tablet (650 mg total) by mouth every 8 (eight) hours as needed for pain.   aspirin EC 81 MG tablet Take 81 mg by mouth daily.   atorvastatin 20 MG tablet Commonly known as:  LIPITOR Take 20 mg by mouth daily.   cephALEXin 250 MG capsule Commonly known as:  KEFLEX Take 1 capsule (250 mg total) by mouth every 6 (six) hours.   docusate sodium 100  MG capsule Commonly known as:  COLACE Take 1 capsule (100 mg total) by mouth every 12 (twelve) hours.   HYDROcodone-acetaminophen 5-325 MG tablet Commonly known as:  NORCO/VICODIN Take 1 tablet by mouth every 12 (twelve) hours as needed for moderate pain.   levETIRAcetam 500 MG tablet Commonly known as:  KEPPRA Take 2 tablets (1,000 mg total) by mouth 2 (two) times daily.   lidocaine 5 % Commonly known as:  LIDODERM Place 1 patch onto the skin daily. Remove & Discard patch within 12 hours or as directed by MD   methocarbamol 500 MG tablet Commonly known as:  ROBAXIN Take 1 tablet (500 mg total) by mouth 2 (two) times daily.   oxyCODONE-acetaminophen 5-325 MG tablet Commonly known as:  PERCOCET/ROXICET Take 2 tablets by mouth every 4 (four) hours as needed for severe pain.            Discharge Care Instructions        Start     Ordered   09/04/17 0000  [facility] SCREENING LAB  Comments:  This external order was created through the Results Console.   09/04/17 0944      No orders of the defined types were placed in this encounter.   Immunization History  Administered Date(s) Administered  . Influenza-Unspecified 07/10/2017  . Pneumococcal Conjugate-13 08/19/2017  . Tdap 06/19/1963  . Varicella 06/18/1994    Social History  Substance Use Topics  . Smoking status: Never Smoker  . Smokeless tobacco: Never Used  . Alcohol use No    Family history is   Family History  Problem Relation Age of Onset  . Cancer Mother       Review of Systems  DATA OBTAINED: from patient, nurse GENERAL:  no fevers, fatigue, appetite changes SKIN: No itching, or rash EYES: No eye pain, redness, discharge EARS: No earache, tinnitus, change in hearing NOSE: No congestion, drainage or bleeding  MOUTH/THROAT: No mouth or tooth pain, No sore throat RESPIRATORY: No cough, wheezing, SOB CARDIAC: No chest pain, palpitations, lower extremity edema  GI: No abdominal pain,  No N/V/D or constipation, No heartburn or reflux  GU: No dysuria, frequency or urgency, or incontinence  MUSCULOSKELETAL: No unrelieved bone/joint pain NEUROLOGIC: No headache, dizziness or focal weakness PSYCHIATRIC: No c/o anxiety or sadness   Vitals:   09/04/17 0921  BP: 130/62  Pulse: 84  Resp: 18  Temp: 98.4 F (36.9 C)    SpO2 Readings from Last 1 Encounters:  09/03/17 98%   Body mass index is 24.12 kg/m.     Physical Exam  GENERAL APPEARANCE: Alert, conversant,  No acute distress.  SKIN: No diaphoresis rash HEAD: Normocephalic, atraumatic  EYES: Conjunctiva/lids clear. Pupils round, reactive. EOMs intact.  EARS: External exam WNL, canals clear. Hearing grossly normal.  NOSE: No deformity or discharge.  MOUTH/THROAT: Lips w/o lesions  RESPIRATORY: Breathing is even, unlabored. Lung sounds are clear   CARDIOVASCULAR: Heart RRR no murmurs, rubs or gallops. Trace  peripheral edema.   GASTROINTESTINAL: Abdomen is soft, non-tender, not distended w/ normal bowel sounds. GENITOURINARY: Bladder non tender, not distended  MUSCULOSKELETAL: No abnormal joints or musculature NEUROLOGIC:  Cranial nerves 2-12 grossly intact. Moves all extremities  PSYCHIATRIC: Mood and affect , no behavioral issues  Patient Active Problem List   Diagnosis Date Noted  . Secondary malignant neoplasm of brain ([place]) 09/04/2017  . Acute encephalopathy   . Pressure injury of skin 08/31/2017  . Postictal state ([place])   . Stroke ([place]) 08/30/2017  . Mental status alteration 08/30/2017  . Dementia 08/30/2017  . Falls frequently 08/30/2017  . Left rib fracture 08/30/2017  . Abnormality of gait due to impairment of balance 08/30/2017  . Encephalomalacia on imaging study 08/30/2017  . History of breast cancer 08/30/2017  . Vertebral fracture, osteoporotic ([place]) 08/30/2017  . Osteoporosis 08/30/2017  . Ataxia 08/19/2017  . Aphasia 01/27/2017  . TIA (transient ischemic attack) 11/16/2015  .  Malignant neoplasm of right female breast ([place]) 05/01/2013  . History of external beam radiation therapy 12/10/2002      Labs reviewed: Basic Metabolic Panel:    Component Value Date/Time   NA 139 09/02/2017 1127   K 3.3 (L) 09/02/2017 1127   CL 107 09/02/2017 1127   CO2 24 09/02/2017 1127   GLUCOSE 111 (H) 09/02/2017 1127   BUN 12 09/02/2017 1127   CREATININE 0.82 09/02/2017 1127   CALCIUM 8.7 (L) 09/02/2017 1127   PROT 6.3 (L) 08/29/2017 2151   ALBUMIN 3.6 08/29/2017 2151   AST 46 (H) 08/29/2017 2151  ALT 51 08/29/2017 2151   ALKPHOS 100 08/29/2017 2151   BILITOT 0.6 08/29/2017 2151   GFRNONAA >60 09/02/2017 1127   GFRAA >60 09/02/2017 1127     Recent Labs  08/31/17 1241 09/01/17 0652 09/02/17 1127  NA 137 139 139  K 4.2 3.1* 3.3*  CL 108 108 107  CO2 22 25 24   GLUCOSE 101* 95 111*  BUN 11 11 12   CREATININE 0.63 0.67 0.82  CALCIUM 8.9 8.9 8.7*   Liver Function Tests:  Recent Labs  08/26/17 1410 08/28/17 0942 08/29/17 2151  AST 22 21 46*  ALT 19 18 51  ALKPHOS 73 75 100  BILITOT 0.8 0.6 0.6  PROT 7.2 6.8 6.3*  ALBUMIN 3.9 3.6 3.6   No results for input(s): LIPASE, AMYLASE in the last 8760 hours. No results for input(s): AMMONIA in the last 8760 hours. CBC:  Recent Labs  08/26/17 1410 08/28/17 0942 08/29/17 2151 08/29/17 2156  WBC 8.9 6.7 9.1  --   NEUTROABS 5.8 4.8 5.9  --   HGB 13.2 12.0 12.0 12.2  HCT 40.2 36.4 37.7 36.0  MCV 86.5 87.1 86.9  --   PLT 296 269 266  --    Lipid  Recent Labs  08/30/17 0246  CHOL 140  HDL 50  LDLCALC 70  TRIG 101    Cardiac Enzymes: No results for input(s): CKTOTAL, CKMB, CKMBINDEX, TROPONINI in the last 8760 hours. BNP: No results for input(s): BNP in the last 8760 hours. No results found for: [facility] Lab Results  Component Value Date   HGBA1C 5.3 08/30/2017   Lab Results  Component Value Date   TSH 0.354 08/30/2017   Lab Results  Component Value Date   AESLPNPY05 110 08/30/2017    No results found for: FOLATE No results found for: IRON, TIBC, FERRITIN  Imaging and Procedures obtained prior to SNF admission: Ct Angio Head W Or Wo Contrast  Result Date: 09/02/2017 CLINICAL DATA:  Aphasia. EXAM: CT ANGIOGRAPHY HEAD AND NECK TECHNIQUE: Multidetector CT imaging of the head and neck was performed using the standard protocol during bolus administration of intravenous contrast. Multiplanar CT image reconstructions and MIPs were obtained to evaluate the vascular anatomy. Carotid stenosis measurements (when applicable) are obtained utilizing NASCET criteria, using the distal internal carotid diameter as the denominator. CONTRAST:  50 cc Isovue 370 intravenous COMPARISON:  Head CT from earlier today.  Brain MRI 08/30/2017 FINDINGS: CTA NECK FINDINGS Aortic arch: Atherosclerosis. No acute finding. Three vessel branching. Right carotid system: There is mild atheromatous wall thickening of the common carotid artery. Vessels are smooth and widely patent. Mild ICA tortuosity. Left carotid system: Vessels are smooth and widely patent. No notable atheromatous changes. Vertebral arteries: Proximal subclavian atherosclerosis without flow limiting stenosis. Mild atheromatous narrowing of the right V1 segment. No high-grade stenosis, ulceration, or dissection. Skeleton: No acute or aggressive finding. Right retromastoid craniotomy, reportedly for brain metastasis resection. Other neck: No incidental mass or adenopathy. Upper chest: Subpleural reticulation in the right upper lobe, likely from radiotherapy. Partly seen changes of right axillary dissection. Review of the MIP images confirms the above findings CTA HEAD FINDINGS Anterior circulation: No branch occlusion or flow limiting stenosis. Mild bilateral M2 segment narrowings, most notable in the left MCA inferior division. Negative for aneurysm or beading. Posterior circulation: Essentially codominant vertebral arteries that are tortuous. Hypoplastic  right P1 segment. Symmetric good opacification of bilateral posterior cerebral arteries. Unremarkable bilateral cerebellar arteries. Venous sinuses: Patent on the delayed phase. Anatomic variants:  As above Delayed phase: No abnormal intracranial enhancement to suggest recurrent metastatic disease. There is encephalomalacia deep to the right retromastoid craniotomy, reported site of brain metastasis treatment. Confluent low-density in the cerebral white matter with central atrophy and ventriculomegaly, suspected sequela of whole-brain radiotherapy. Case reviewed in person with Dr. Leonel Ramsay. Review of the MIP images confirms the above findings IMPRESSION: 1. No emergent large vessel occlusion. 2. Overall mild atherosclerosis. No flow limiting stenosis in the head or neck. 3. History of brain metastases with surgery and radiation. No evidence of active metastatic disease on postcontrast scan. Electronically Signed   By: Monte Fantasia M.D.   On: 09/02/2017 11:37   Ct Angio Neck W Or Wo Contrast  Result Date: 09/02/2017 CLINICAL DATA:  Aphasia. EXAM: CT ANGIOGRAPHY HEAD AND NECK TECHNIQUE: Multidetector CT imaging of the head and neck was performed using the standard protocol during bolus administration of intravenous contrast. Multiplanar CT image reconstructions and MIPs were obtained to evaluate the vascular anatomy. Carotid stenosis measurements (when applicable) are obtained utilizing NASCET criteria, using the distal internal carotid diameter as the denominator. CONTRAST:  50 cc Isovue 370 intravenous COMPARISON:  Head CT from earlier today.  Brain MRI 08/30/2017 FINDINGS: CTA NECK FINDINGS Aortic arch: Atherosclerosis. No acute finding. Three vessel branching. Right carotid system: There is mild atheromatous wall thickening of the common carotid artery. Vessels are smooth and widely patent. Mild ICA tortuosity. Left carotid system: Vessels are smooth and widely patent. No notable atheromatous changes.  Vertebral arteries: Proximal subclavian atherosclerosis without flow limiting stenosis. Mild atheromatous narrowing of the right V1 segment. No high-grade stenosis, ulceration, or dissection. Skeleton: No acute or aggressive finding. Right retromastoid craniotomy, reportedly for brain metastasis resection. Other neck: No incidental mass or adenopathy. Upper chest: Subpleural reticulation in the right upper lobe, likely from radiotherapy. Partly seen changes of right axillary dissection. Review of the MIP images confirms the above findings CTA HEAD FINDINGS Anterior circulation: No branch occlusion or flow limiting stenosis. Mild bilateral M2 segment narrowings, most notable in the left MCA inferior division. Negative for aneurysm or beading. Posterior circulation: Essentially codominant vertebral arteries that are tortuous. Hypoplastic right P1 segment. Symmetric good opacification of bilateral posterior cerebral arteries. Unremarkable bilateral cerebellar arteries. Venous sinuses: Patent on the delayed phase. Anatomic variants: As above Delayed phase: No abnormal intracranial enhancement to suggest recurrent metastatic disease. There is encephalomalacia deep to the right retromastoid craniotomy, reported site of brain metastasis treatment. Confluent low-density in the cerebral white matter with central atrophy and ventriculomegaly, suspected sequela of whole-brain radiotherapy. Case reviewed in person with Dr. Leonel Ramsay. Review of the MIP images confirms the above findings IMPRESSION: 1. No emergent large vessel occlusion. 2. Overall mild atherosclerosis. No flow limiting stenosis in the head or neck. 3. History of brain metastases with surgery and radiation. No evidence of active metastatic disease on postcontrast scan. Electronically Signed   By: Monte Fantasia M.D.   On: 09/02/2017 11:37   Mr Jeri Cos IR Contrast  Result Date: 09/03/2017 CLINICAL DATA:  Acute altered mental status EXAM: MRI HEAD WITHOUT  AND WITH CONTRAST TECHNIQUE: Multiplanar, multiecho pulse sequences of the brain and surrounding structures were obtained without and with intravenous contrast. CONTRAST:  51m MULTIHANCE GADOBENATE DIMEGLUMINE 529 MG/ML IV SOLN COMPARISON:  Head CT 09/02/2017 Brain MRI 08/30/2017 FINDINGS: Brain: The midline structures are normal. There is no focal diffusion restriction to indicate acute infarct. Old right cerebellar infarct. There is diffuse confluent hyperintense T2-weighted signal within the periventricular, juxtacortical  and deep white matter, most often seen in the setting of chronic microvascular ischemia. There are multiple foci of chronic microhemorrhage within the cerebellum and left parietal lobe. No acute hemorrhage. The chronic micro hemorrhagic foci are predominantly peripheral in distribution. No abnormal parenchymal contrast enhancement. Volume loss is greater than expected for age. The dura is normal and there is no extra-axial collection. Vascular: Major intracranial arterial and venous sinus flow voids are preserved. Skull and upper cervical spine: The visualized skull base, calvarium, upper cervical spine and extracranial soft tissues are normal. Sinuses/Orbits: No fluid levels or advanced mucosal thickening. No mastoid or middle ear effusion. Normal orbits. IMPRESSION: 1. No acute abnormality. 2. Severe chronic ischemic microangiopathy, age advanced volume loss and old right cerebellar infarct. 3. Few scattered, peripheral predominant foci of chronic microhemorrhage. This may indicate underlying cerebral amyloid angiopathy. Electronically Signed   By: Ulyses Jarred M.D.   On: 09/03/2017 06:53   Ct Head Code Stroke Wo Contrast`  Result Date: 09/02/2017 CLINICAL DATA:  Code stroke. Altered mental status. Abnormal speech. New onset right-sided weakness. Right facial droop. Symptoms began 4 days ago. Personal history of metastatic breast cancer with whole-brain radiation. EXAM: CT HEAD WITHOUT  CONTRAST TECHNIQUE: Contiguous axial images were obtained from the base of the skull through the vertex without intravenous contrast. COMPARISON:  CT head without contrast 08/29/2017. MRI brain 08/30/2017. FINDINGS: Brain: Extensive white matter hypoattenuation is again noted. No acute cortical lesion is present. Dilated perivascular spaces versus remote lacunar infarcts of the basal ganglia are stable. No acute hemorrhage or mass lesion is present. The ventricles are proportionate to the degree of atrophy. No significant extra-axial fluid collection is present. Postsurgical changes are present in the right cerebellum. Vascular: Vascular calcifications are again noted within the cavernous internal carotid artery is. There is no hyperdense vessel. Skull: Right occipital craniotomy is noted. The calvarium is otherwise intact. No focal lytic or blastic lesions are present. Sinuses/Orbits: The paranasal sinuses and mastoid air cells are clear. Bilateral globes and orbits are within normal limits. ASPECTS [facility] Stroke Program Early CT Score) - Ganglionic level infarction (caudate, lentiform nuclei, internal capsule, insula, M1-M3 cortex): 7/7 - Supraganglionic infarction (M4-M6 cortex): 3/3 Total score (0-10 with 10 being normal): 10/10 IMPRESSION: 1. No acute intracranial abnormality or significant interval change. 2. Stable diffuse white matter changes compatible with prior whole-brain radiation. 3. Postsurgical changes of the right cerebellum 4. ASPECTS is 10/10 These results were text paged at the time of interpretation on 09/02/2017 at 11:18 am to Dr. Leonel Ramsay. Electronically Signed   By: San Morelle M.D.   On: 09/02/2017 11:19     Not all labs, radiology exams or other studies done during hospitalization come through on my EPIC note; however they are reviewed by me.    Assessment and Plan  COMPLEX PARTIAL SEIZURE/ALTERED MENTAL STATUS-presenting with right facial weakness and LOC that was  considered her post ictal state; CT head, EEG, and MRI all with no acute disease; patient started on Keppra 1000 mg twice a day; symptoms occurred once after patient had been loaded with Keppra and was taking Keppra at the hospital SNF - admitted to skilled nursing facility for OT/PT; continue Keppra 1000 mg twice a day  E COLI UTI-treated with Keflex for 7 days which will continue till 9/29 SNF - continue Keflex 250 mg every 6 hours until 9/29  ACUTE RIB FRACTURES-FROM PRIOR FALL SNF - continue Lortab 5 mg every 12 when necessary, oxycodone 5 mg 2 every  4 when necessary and lidocaine 5% patch for pain  HYPERLIPIDEMIA ANF - not stated as uncontrolled; continue Lipitor 20 mg by mouth daily  DEMENTIA-new diagnosis, early SNF - supportive care; patient's asthma aware    Time spent greater than 45 minutes;> 50% of time with patient was spent reviewing records, labs, tests and studies, counseling and developing plan of care  Webb Silversmith D. Sheppard Coil, MD

## 2017-09-07 ENCOUNTER — Encounter: Payer: Self-pay | Admitting: Internal Medicine

## 2017-09-07 DIAGNOSIS — G40209 Localization-related (focal) (partial) symptomatic epilepsy and epileptic syndromes with complex partial seizures, not intractable, without status epilepticus: Secondary | ICD-10-CM | POA: Insufficient documentation

## 2017-09-07 DIAGNOSIS — N390 Urinary tract infection, site not specified: Secondary | ICD-10-CM

## 2017-09-07 DIAGNOSIS — B9620 Unspecified Escherichia coli [E. coli] as the cause of diseases classified elsewhere: Secondary | ICD-10-CM | POA: Insufficient documentation

## 2017-09-07 DIAGNOSIS — E785 Hyperlipidemia, unspecified: Secondary | ICD-10-CM | POA: Insufficient documentation

## 2017-09-10 ENCOUNTER — Non-Acute Institutional Stay (SKILLED_NURSING_FACILITY): Payer: Medicare Other | Admitting: Internal Medicine

## 2017-09-10 ENCOUNTER — Encounter: Payer: Self-pay | Admitting: Internal Medicine

## 2017-09-10 DIAGNOSIS — R410 Disorientation, unspecified: Secondary | ICD-10-CM

## 2017-09-10 DIAGNOSIS — G40209 Localization-related (focal) (partial) symptomatic epilepsy and epileptic syndromes with complex partial seizures, not intractable, without status epilepticus: Secondary | ICD-10-CM | POA: Diagnosis not present

## 2017-09-10 DIAGNOSIS — R569 Unspecified convulsions: Secondary | ICD-10-CM

## 2017-09-10 DIAGNOSIS — N390 Urinary tract infection, site not specified: Secondary | ICD-10-CM

## 2017-09-10 DIAGNOSIS — E785 Hyperlipidemia, unspecified: Secondary | ICD-10-CM | POA: Diagnosis not present

## 2017-09-10 DIAGNOSIS — F0390 Unspecified dementia without behavioral disturbance: Secondary | ICD-10-CM | POA: Diagnosis not present

## 2017-09-10 DIAGNOSIS — G9340 Encephalopathy, unspecified: Secondary | ICD-10-CM

## 2017-09-10 DIAGNOSIS — S2242XD Multiple fractures of ribs, left side, subsequent encounter for fracture with routine healing: Secondary | ICD-10-CM | POA: Diagnosis not present

## 2017-09-10 DIAGNOSIS — B9620 Unspecified Escherichia coli [E. coli] as the cause of diseases classified elsewhere: Secondary | ICD-10-CM | POA: Diagnosis not present

## 2017-09-10 NOTE — Progress Notes (Signed)
Location:  [facility] Room Number: 323 Place of Service:  SNF (31)  PCP: Hennie Duos, MD Patient Care Team: Hennie Duos, MD as PCP - General (Internal Medicine)  Extended Emergency Contact Information Primary Emergency Contact: Gow,Jim Address: [address]          [place], [place] [address] Johnnette Litter of [place] Phone: [phone] Relation: Spouse Secondary Emergency Contact: Vernie Ammons [place] of [place] Mobile Phone: [phone] Relation: Daughter  Allergies  Allergen Reactions  . Morphine And Related Nausea And Vomiting  . Codeine Nausea And Vomiting    Chief Complaint  Patient presents with  . Discharge Note    discharge from SNF to home    HPI:  71 y.o. 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 She was admitted mostly [facility] from 9/20-25 where patient was diagnosed with a complex partial seizure with a postictal state as cause for her symptoms. Hospital course was, K by Escherichia coli UTI treated with Keflex. While in PT in the hospital patient had another episode where she appeared to have facial droop and weakness, code stroke was called again, and CT again showed no acute abnormality. Repeat EEG was negative as well. Patient was admitted to skilled nursing facility for generalized weakness for OT/PT and is now ready to be discharged to a skilled nursing facility in [facility].    Past Medical History:  Diagnosis Date  . Abnormality of gait due to impairment of balance 08/30/2017  . Acute encephalopathy   . Aphasia 01/27/2017   Last Assessment & Plan:  Symptoms concerning for stroke or transient ischemic attack but patient has had aphasia and weakness with falls multiple times in the past and  her stroke workup has been negative. She was not a candidate for tPA due to bleeding risk Monitoring:  Telemetry x 48 hours to evaluate for arrhythmias, Neuro checks q2hours x 24 hours, then q4hours. Imaging: CTA of the brain and neck, carotid dopplers, echocardiogram with bubble study  Treatment: ASA statin Consults: PT/OT/ST/Rehab hospital assessment and Neurology. Education: Stroke education plan and risk factor modification. Diet to advance as per speech and language evaluation. Maintain euglycemia and allow permissive hypertension DVT prophylaxis as per protocol  . Arthritis    "knees, lower back" (08/30/2017)  . Ataxia 08/19/2017  . Breast cancer metastasized to brain [facility]) 2004   S/P chemo and radiation  . Breast cancer, right breast ([place]) 2001  . Chronic lower back pain   . Dementia   . Encephalomalacia on imaging study 08/30/2017   Head CT 08/29/2017  . Falls frequently 08/30/2017  . GERD (gastroesophageal reflux disease)   . Headache    "almost daily" (08/30/2017)  . Heart murmur   . History of breast cancer 08/30/2017   Metastatic Breast cancer 2001.  Bilateral Mastectomy.   Marland Kitchen History of external beam radiation therapy 12/10/2002   Whole brain radiation 2004  . Hyperlipidemia   . Left rib fracture 08/30/2017  . 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 Osteoporosis 08/30/2017  . PONV  (postoperative nausea and vomiting)   . Rib fractures 08/26/2017   "fell; broke 2 on the left"  . Secondary malignant neoplasm of brain ([place]) 09/04/2017  . Seizures ([place])    "we were told there was evidence of previous seizures last night in ER; & she had more last night" (08/30/2017)  . Stroke ([place]) 08/30/2017  . TIA (transient ischemic attack) 11/16/2015  . Vertebral fracture, osteoporotic ([place]) 08/30/2017    Past Surgical History:  Procedure Laterality Date  . ABDOMINAL HYSTERECTOMY  1985  . APPENDECTOMY    . BRAIN TUMOR EXCISION  2004   "got it all; still had to have chemo & full brain radiation"  . BREAST SURGERY Right ~ 2000  . CHOLECYSTECTOMY OPEN  ~ 2013  . MASTECTOMY Bilateral 2001   "prophylactic on left side; cancer on right side; went from breast to brain 4 years later"     reports that she has never smoked. She has never used smokeless tobacco. She reports that she does not drink alcohol or use drugs. Social History   Social History  . Marital status: Married    Spouse name: N/A  . Number of children: N/A  . Years of education: N/A   Occupational History  . retired Education officer, [employer]    Social History Main Topics  . Smoking status: Never Smoker  . Smokeless tobacco: Never Used  . Alcohol use No  . Drug use: No  . Sexual activity: No   Other Topics Concern  . Not on file   Social History Narrative   Admitted to [facility] 09/03/17   Wonda Cheng   Never smoked   Alcohol none   Full Code    Pertinent  Health Maintenance Due  Topic Date Due  . MAMMOGRAM  06/18/1994  . COLONOSCOPY  06/18/1994  . DEXA SCAN  06/18/2009  . PNA vac Low Risk Adult (2 of 2 - PPSV23) 08/19/2018  . INFLUENZA VACCINE  Completed    Medications: Allergies as of 09/10/2017      Reactions   Morphine And Related Nausea And Vomiting   Codeine Nausea And Vomiting      Medication List       Accurate as of 09/10/17 11:59 PM. Always use your most recent med list.            acetaminophen 650 MG CR tablet Commonly known as:  TYLENOL 8 HOUR Take 1 tablet (650 mg total) by mouth every 8 (eight) hours as needed for pain.   aspirin EC 81 MG tablet Take 81 mg by mouth daily.   atorvastatin 20 MG tablet Commonly known as:  LIPITOR Take 20 mg by mouth daily.   docusate sodium 100 MG capsule Commonly known as:  COLACE Take 1 capsule (100 mg total) by mouth every 12 (twelve) hours.   HYDROcodone-acetaminophen 5-325 MG tablet Commonly known as:  NORCO/VICODIN Take 1 tablet by mouth every 12 (twelve) hours as needed for moderate pain. For 2 weeks #28   levETIRAcetam 500 MG tablet Commonly known as:  KEPPRA Take 2 tablets (1,000 mg total) by mouth 2 (two) times daily.   lidocaine 5 % Commonly known as:  LIDODERM Place 1 patch onto the skin daily. Remove &  Discard patch within 12 hours or as directed by MD   methocarbamol 500 MG tablet Commonly known as:  ROBAXIN Take 1 tablet (500 mg total) by mouth 2 (two) times daily.        Vitals:   09/10/17 1624  BP: (!) 151/78  Pulse: 82  Resp: 19  Temp: 98.5 F (36.9 C)  Weight: 154 lb (69.9 kg)  Height: 5' 7"  (1.702 m)   Body mass index is 24.12 kg/m.  Physical Exam  GENERAL APPEARANCE: Alert, conversant. No acute distress.  HEENT: Unremarkable. RESPIRATORY: Breathing is even, unlabored. Lung sounds are clear   CARDIOVASCULAR: Heart RRR no murmurs, rubs or gallops. No peripheral edema.  GASTROINTESTINAL: Abdomen is soft, non-tender, not distended w/ normal bowel sounds.  NEUROLOGIC: Cranial nerves 2-12 grossly intact. Moves all extremities   Labs reviewed: Basic Metabolic Panel:  Recent Labs  08/31/17 1241 09/01/17 0652 09/02/17 1127  NA 137 139 139  K 4.2 3.1* 3.3*  CL 108 108 107  CO2 22 25 24   GLUCOSE 101* 95 111*  BUN 11 11 12   CREATININE 0.63 0.67 0.82  CALCIUM 8.9 8.9 8.7*   No results found for: [facility] Liver Function Tests:  Recent Labs  08/26/17 1410  08/28/17 0942 08/29/17 2151  AST 22 21 46*  ALT 19 18 51  ALKPHOS 73 75 100  BILITOT 0.8 0.6 0.6  PROT 7.2 6.8 6.3*  ALBUMIN 3.9 3.6 3.6   No results for input(s): LIPASE, AMYLASE in the last 8760 hours. No results for input(s): AMMONIA in the last 8760 hours. CBC:  Recent Labs  08/26/17 1410 08/28/17 0942 08/29/17 2151 08/29/17 2156  WBC 8.9 6.7 9.1  --   NEUTROABS 5.8 4.8 5.9  --   HGB 13.2 12.0 12.0 12.2  HCT 40.2 36.4 37.7 36.0  MCV 86.5 87.1 86.9  --   PLT 296 269 266  --    Lipid  Recent Labs  08/30/17 0246  CHOL 140  HDL 50  LDLCALC 70  TRIG 101   Cardiac Enzymes: No results for input(s): CKTOTAL, CKMB, CKMBINDEX, TROPONINI in the last 8760 hours. BNP: No results for input(s): BNP in the last 8760 hours. CBG:  Recent Labs  08/29/17 2151 09/02/17 1042  GLUCAP 126* 131*    Procedures and Imaging Studies During Stay: Ct Angio Head W Or Wo Contrast  Result Date: 09/02/2017 CLINICAL DATA:  Aphasia. EXAM: CT ANGIOGRAPHY HEAD AND NECK TECHNIQUE: Multidetector CT imaging of the head and neck was performed using the standard protocol during bolus administration of intravenous contrast. Multiplanar CT image reconstructions and MIPs were obtained to evaluate the vascular anatomy. Carotid stenosis measurements (when applicable) are obtained utilizing NASCET criteria, using the distal internal carotid diameter as the denominator. CONTRAST:  50 cc Isovue 370 intravenous COMPARISON:  Head CT from earlier today.  Brain MRI 08/30/2017 FINDINGS: CTA NECK FINDINGS Aortic arch: Atherosclerosis. No acute finding. Three vessel branching. Right carotid system: There is mild atheromatous wall thickening of the common carotid artery. Vessels are smooth and widely patent. Mild ICA tortuosity. Left carotid system: Vessels are smooth and widely patent. No notable atheromatous changes. Vertebral arteries: Proximal subclavian atherosclerosis without flow limiting stenosis. Mild  atheromatous narrowing of the right V1 segment. No high-grade stenosis, ulceration, or dissection. Skeleton: No acute or aggressive finding. Right retromastoid craniotomy, reportedly for brain metastasis resection. Other neck: No incidental mass or adenopathy. Upper chest: Subpleural reticulation in the right upper lobe, likely from radiotherapy. Partly seen changes of  right axillary dissection. Review of the MIP images confirms the above findings CTA HEAD FINDINGS Anterior circulation: No branch occlusion or flow limiting stenosis. Mild bilateral M2 segment narrowings, most notable in the left MCA inferior division. Negative for aneurysm or beading. Posterior circulation: Essentially codominant vertebral arteries that are tortuous. Hypoplastic right P1 segment. Symmetric good opacification of bilateral posterior cerebral arteries. Unremarkable bilateral cerebellar arteries. Venous sinuses: Patent on the delayed phase. Anatomic variants: As above Delayed phase: No abnormal intracranial enhancement to suggest recurrent metastatic disease. There is encephalomalacia deep to the right retromastoid craniotomy, reported site of brain metastasis treatment. Confluent low-density in the cerebral white matter with central atrophy and ventriculomegaly, suspected sequela of whole-brain radiotherapy. Case reviewed in person with Dr. Leonel Ramsay. Review of the MIP images confirms the above findings IMPRESSION: 1. No emergent large vessel occlusion. 2. Overall mild atherosclerosis. No flow limiting stenosis in the head or neck. 3. History of brain metastases with surgery and radiation. No evidence of active metastatic disease on postcontrast scan. Electronically Signed   By: Monte Fantasia M.D.   On: 09/02/2017 11:37   Dg Chest 2 View  Result Date: 08/30/2017 CLINICAL DATA:  Chest pain EXAM: CHEST  2 VIEW COMPARISON:  08/28/2017, 08/26/2017 FINDINGS: Low lung volumes. No acute consolidation or pleural effusion. Borderline  cardiomegaly, augmented by low lung volume. No pneumothorax. Surgical clips in the left upper quadrant and right chest. Compression deformity at T12. IMPRESSION: Low lung volumes with borderline cardiomegaly. No infiltrate or edema Electronically Signed   By: Donavan Foil M.D.   On: 08/30/2017 02:30   Dg Ribs Unilateral W/chest Left  Result Date: 08/26/2017 CLINICAL DATA:  Fall 2 days ago. Left posterior rib pain and bruising. Initial encounter. EXAM: LEFT RIBS AND CHEST - 3+ VIEW COMPARISON:  None. FINDINGS: No fracture or other bone lesions are seen involving the ribs. There is no evidence of pneumothorax or pleural effusion. Both lungs are clear. Heart size and mediastinal contours are within normal limits. Surgical clips again noted bilaterally. IMPRESSION: No acute findings. Electronically Signed   By: Earle Gell M.D.   On: 08/26/2017 14:13   Ct Head Wo Contrast  Result Date: 08/26/2017 CLINICAL DATA:  Multiple falls over the past week. No reported LOC. Dementia. Breast cancer. Imbalance. EXAM: CT HEAD WITHOUT CONTRAST TECHNIQUE: Contiguous axial images were obtained from the base of the skull through the vertex without intravenous contrast. COMPARISON:  None. FINDINGS: Brain: No acute stroke, acute hemorrhage, mass lesion, hydrocephalus, or extra-axial fluid. Advanced atrophy. Extensive hypoattenuation of white matter, likely chronic microvascular ischemic change and/or post treatment effect. RIGHT cerebellar encephalomalacia. Given the skull defect, this is likely postsurgical, in this patient with history of breast cancer and previous brain metastasis. Vascular: Calcification of the cavernous internal carotid arteries consistent with cerebrovascular atherosclerotic disease. No signs of intracranial large vessel occlusion. Skull: RIGHT cerebellar craniotomy defect. No osseous metastatic disease. Sinuses/Orbits: Negative. Other: None. IMPRESSION: Advanced brain substance loss. Extensive  hypoattenuation of white matter. No posttraumatic sequelae are evident. Postsurgical change RIGHT cerebellum, likely related to metastatic breast cancer. No features to suggest intracranial mass lesion on today's study. Electronically Signed   By: Staci Righter M.D.   On: 08/26/2017 14:14   Ct Angio Neck W Or Wo Contrast  Result Date: 09/02/2017 CLINICAL DATA:  Aphasia. EXAM: CT ANGIOGRAPHY HEAD AND NECK TECHNIQUE: Multidetector CT imaging of the head and neck was performed using the standard protocol during bolus administration of intravenous contrast. Multiplanar CT image  reconstructions and MIPs were obtained to evaluate the vascular anatomy. Carotid stenosis measurements (when applicable) are obtained utilizing NASCET criteria, using the distal internal carotid diameter as the denominator. CONTRAST:  50 cc Isovue 370 intravenous COMPARISON:  Head CT from earlier today.  Brain MRI 08/30/2017 FINDINGS: CTA NECK FINDINGS Aortic arch: Atherosclerosis. No acute finding. Three vessel branching. Right carotid system: There is mild atheromatous wall thickening of the common carotid artery. Vessels are smooth and widely patent. Mild ICA tortuosity. Left carotid system: Vessels are smooth and widely patent. No notable atheromatous changes. Vertebral arteries: Proximal subclavian atherosclerosis without flow limiting stenosis. Mild atheromatous narrowing of the right V1 segment. No high-grade stenosis, ulceration, or dissection. Skeleton: No acute or aggressive finding. Right retromastoid craniotomy, reportedly for brain metastasis resection. Other neck: No incidental mass or adenopathy. Upper chest: Subpleural reticulation in the right upper lobe, likely from radiotherapy. Partly seen changes of right axillary dissection. Review of the MIP images confirms the above findings CTA HEAD FINDINGS Anterior circulation: No branch occlusion or flow limiting stenosis. Mild bilateral M2 segment narrowings, most notable in the  left MCA inferior division. Negative for aneurysm or beading. Posterior circulation: Essentially codominant vertebral arteries that are tortuous. Hypoplastic right P1 segment. Symmetric good opacification of bilateral posterior cerebral arteries. Unremarkable bilateral cerebellar arteries. Venous sinuses: Patent on the delayed phase. Anatomic variants: As above Delayed phase: No abnormal intracranial enhancement to suggest recurrent metastatic disease. There is encephalomalacia deep to the right retromastoid craniotomy, reported site of brain metastasis treatment. Confluent low-density in the cerebral white matter with central atrophy and ventriculomegaly, suspected sequela of whole-brain radiotherapy. Case reviewed in person with Dr. Leonel Ramsay. Review of the MIP images confirms the above findings IMPRESSION: 1. No emergent large vessel occlusion. 2. Overall mild atherosclerosis. No flow limiting stenosis in the head or neck. 3. History of brain metastases with surgery and radiation. No evidence of active metastatic disease on postcontrast scan. Electronically Signed   By: Monte Fantasia M.D.   On: 09/02/2017 11:37   Ct Chest W Contrast  Result Date: 08/28/2017 CLINICAL DATA:  Fall on Monday.  Left lower rib and back pain. EXAM: CT CHEST, ABDOMEN, AND PELVIS WITH CONTRAST TECHNIQUE: Multidetector CT imaging of the chest, abdomen and pelvis was performed following the standard protocol during bolus administration of intravenous contrast. CONTRAST:  12m ISOVUE-300 IOPAMIDOL (ISOVUE-300) INJECTION 61% COMPARISON:  None. FINDINGS: CT CHEST FINDINGS Cardiovascular: Heart is normal size. Aorta is normal caliber. Mediastinum/Nodes: No mediastinal, hilar, or axillary adenopathy. No evidence of mediastinal hematoma Lungs/Pleura: Biapical scarring. Scarring in the right middle lobe and lingula. Minimal left base atelectasis dependently. No effusions or pneumothorax. Musculoskeletal: Fracture through the  posterolateral left ninth and tenth ribs. CT ABDOMEN PELVIS FINDINGS Hepatobiliary: No hepatic injury or perihepatic hematoma. Gallbladder is unremarkable Pancreas: No focal abnormality or ductal dilatation. Spleen: No splenic injury or perisplenic hematoma. Adrenals/Urinary Tract: No adrenal hemorrhage or renal injury identified. Bladder is unremarkable. Stomach/Bowel: Stomach, large and small bowel grossly unremarkable. Vascular/Lymphatic: Aortic and iliac calcifications. No aneurysm or adenopathy. Reproductive: No visible focal abnormality. Other: No free fluid or free air. Musculoskeletal: Compression fracture through the T12 vertebral body superior endplate, age indeterminate. IMPRESSION: Left ninth and tenth posterior lateral rib fractures. Compression fracture through the superior endplate at TW09 age indeterminate. Left base atelectasis.  Areas of scarring in the lungs bilaterally. No evidence of acute injury in the chest, abdomen or pelvis. Electronically Signed   By: KRolm BaptiseM.D.   On:  08/28/2017 10:20   Mr Brain Wo Contrast  Result Date: 08/30/2017 CLINICAL DATA:  Loss of consciousness.  Seizure. EXAM: MRI HEAD WITHOUT CONTRAST TECHNIQUE: Multiplanar, multiecho pulse sequences of the brain and surrounding structures were obtained without intravenous contrast. COMPARISON:  Head CT 08/29/2017 FINDINGS: The study is degraded by motion, despite efforts to reduce this artifact, including utilization of motion-resistant MR sequences. The findings of the study are interpreted in the context of reduced sensitivity/specificity. Brain: The midline structures are normal. There is no focal diffusion restriction to indicate acute infarct. Old right cerebellar infarct. There is diffuse confluent hyperintense T2-weighted signal within the periventricular, deep and juxtacortical white matter, most often seen in the setting of chronic microvascular ischemia. No intraparenchymal hematoma or chronic  microhemorrhage. There is diffuse volume loss. The dura is normal and there is no extra-axial collection. Vascular: Major intracranial arterial and venous sinus flow voids are preserved. Skull and upper cervical spine: The visualized skull base, calvarium, upper cervical spine and extracranial soft tissues are normal. Sinuses/Orbits: No fluid levels or advanced mucosal thickening. No mastoid or middle ear effusion. Normal orbits. IMPRESSION: 1. Motion degraded examination. Within that limitation, no visualized acute abnormality. 2. Advanced volume loss and chronic ischemic microangiopathy. Electronically Signed   By: Ulyses Jarred M.D.   On: 08/30/2017 02:09   Mr Jeri Cos KG Contrast  Result Date: 09/03/2017 CLINICAL DATA:  Acute altered mental status EXAM: MRI HEAD WITHOUT AND WITH CONTRAST TECHNIQUE: Multiplanar, multiecho pulse sequences of the brain and surrounding structures were obtained without and with intravenous contrast. CONTRAST:  45m MULTIHANCE GADOBENATE DIMEGLUMINE 529 MG/ML IV SOLN COMPARISON:  Head CT 09/02/2017 Brain MRI 08/30/2017 FINDINGS: Brain: The midline structures are normal. There is no focal diffusion restriction to indicate acute infarct. Old right cerebellar infarct. There is diffuse confluent hyperintense T2-weighted signal within the periventricular, juxtacortical and deep white matter, most often seen in the setting of chronic microvascular ischemia. There are multiple foci of chronic microhemorrhage within the cerebellum and left parietal lobe. No acute hemorrhage. The chronic micro hemorrhagic foci are predominantly peripheral in distribution. No abnormal parenchymal contrast enhancement. Volume loss is greater than expected for age. The dura is normal and there is no extra-axial collection. Vascular: Major intracranial arterial and venous sinus flow voids are preserved. Skull and upper cervical spine: The visualized skull base, calvarium, upper cervical spine and extracranial  soft tissues are normal. Sinuses/Orbits: No fluid levels or advanced mucosal thickening. No mastoid or middle ear effusion. Normal orbits. IMPRESSION: 1. No acute abnormality. 2. Severe chronic ischemic microangiopathy, age advanced volume loss and old right cerebellar infarct. 3. Few scattered, peripheral predominant foci of chronic microhemorrhage. This may indicate underlying cerebral amyloid angiopathy. Electronically Signed   By: KUlyses JarredM.D.   On: 09/03/2017 06:53   Ct Abdomen Pelvis W Contrast  Result Date: 08/28/2017 CLINICAL DATA:  Fall on Monday.  Left lower rib and back pain. EXAM: CT CHEST, ABDOMEN, AND PELVIS WITH CONTRAST TECHNIQUE: Multidetector CT imaging of the chest, abdomen and pelvis was performed following the standard protocol during bolus administration of intravenous contrast. CONTRAST:  1083mISOVUE-300 IOPAMIDOL (ISOVUE-300) INJECTION 61% COMPARISON:  None. FINDINGS: CT CHEST FINDINGS Cardiovascular: Heart is normal size. Aorta is normal caliber. Mediastinum/Nodes: No mediastinal, hilar, or axillary adenopathy. No evidence of mediastinal hematoma Lungs/Pleura: Biapical scarring. Scarring in the right middle lobe and lingula. Minimal left base atelectasis dependently. No effusions or pneumothorax. Musculoskeletal: Fracture through the posterolateral left ninth and tenth ribs. CT ABDOMEN  PELVIS FINDINGS Hepatobiliary: No hepatic injury or perihepatic hematoma. Gallbladder is unremarkable Pancreas: No focal abnormality or ductal dilatation. Spleen: No splenic injury or perisplenic hematoma. Adrenals/Urinary Tract: No adrenal hemorrhage or renal injury identified. Bladder is unremarkable. Stomach/Bowel: Stomach, large and small bowel grossly unremarkable. Vascular/Lymphatic: Aortic and iliac calcifications. No aneurysm or adenopathy. Reproductive: No visible focal abnormality. Other: No free fluid or free air. Musculoskeletal: Compression fracture through the T12 vertebral body  superior endplate, age indeterminate. IMPRESSION: Left ninth and tenth posterior lateral rib fractures. Compression fracture through the superior endplate at G18, age indeterminate. Left base atelectasis.  Areas of scarring in the lungs bilaterally. No evidence of acute injury in the chest, abdomen or pelvis. Electronically Signed   By: Rolm Baptise M.D.   On: 08/28/2017 10:20   Ct Head Code Stroke Wo Contrast`  Result Date: 09/02/2017 CLINICAL DATA:  Code stroke. Altered mental status. Abnormal speech. New onset right-sided weakness. Right facial droop. Symptoms began 4 days ago. Personal history of metastatic breast cancer with whole-brain radiation. EXAM: CT HEAD WITHOUT CONTRAST TECHNIQUE: Contiguous axial images were obtained from the base of the skull through the vertex without intravenous contrast. COMPARISON:  CT head without contrast 08/29/2017. MRI brain 08/30/2017. FINDINGS: Brain: Extensive white matter hypoattenuation is again noted. No acute cortical lesion is present. Dilated perivascular spaces versus remote lacunar infarcts of the basal ganglia are stable. No acute hemorrhage or mass lesion is present. The ventricles are proportionate to the degree of atrophy. No significant extra-axial fluid collection is present. Postsurgical changes are present in the right cerebellum. Vascular: Vascular calcifications are again noted within the cavernous internal carotid artery is. There is no hyperdense vessel. Skull: Right occipital craniotomy is noted. The calvarium is otherwise intact. No focal lytic or blastic lesions are present. Sinuses/Orbits: The paranasal sinuses and mastoid air cells are clear. Bilateral globes and orbits are within normal limits. ASPECTS [facility] Stroke Program Early CT Score) - Ganglionic level infarction (caudate, lentiform nuclei, internal capsule, insula, M1-M3 cortex): 7/7 - Supraganglionic infarction (M4-M6 cortex): 3/3 Total score (0-10 with 10 being normal): 10/10  IMPRESSION: 1. No acute intracranial abnormality or significant interval change. 2. Stable diffuse white matter changes compatible with prior whole-brain radiation. 3. Postsurgical changes of the right cerebellum 4. ASPECTS is 10/10 These results were text paged at the time of interpretation on 09/02/2017 at 11:18 am to Dr. Leonel Ramsay. Electronically Signed   By: San Morelle M.D.   On: 09/02/2017 11:19   Ct Head Code Stroke Wo Contrast  Result Date: 08/29/2017 CLINICAL DATA:  Code stroke. Initial evaluation for acute right-sided facial droop. EXAM: CT HEAD WITHOUT CONTRAST TECHNIQUE: Contiguous axial images were obtained from the base of the skull through the vertex without intravenous contrast. COMPARISON:  Prior CT from 08/26/2017. FINDINGS: Brain: Stable atrophy with advanced cerebral white matter changes. Encephalomalacia out within the right cerebellar hemisphere, unchanged. No acute intracranial hemorrhage. No evidence for acute large vessel territory infarct. No mass lesion, midline shift or mass effect. Ventricular prominence related underlying atrophy without hydrocephalus. No extra-axial fluid collection. Vascular: No hyperdense vessel. Scattered vascular calcifications noted within the carotid siphons. Skull: Scalp soft tissues demonstrate no acute abnormality. Post craniotomy changes from remote right occipital craniotomy again noted. Sinuses/Orbits: Globes oral soft tissues within normal limits. Paranasal sinuses are clear. No mastoid effusion. Other: None. ASPECTS [facility] Stroke Program Early CT Score) - Ganglionic level infarction (caudate, lentiform nuclei, internal capsule, insula, M1-M3 cortex): 7 - Supraganglionic infarction (M4-M6 cortex): 3  Total score (0-10 with 10 being normal): 10 IMPRESSION: 1. No acute intracranial infarct or other process identified. 2. ASPECTS is 10 3. Stable atrophy with advanced cerebral white matter hypoattenuation. 4. Sequelae of previous right occipital  craniotomy with underlying right cerebellar encephalomalacia, stable. Critical Value/emergent results were called by telephone at the time of interpretation on 08/29/2017 at 10:20 pm to Dr. Nicole Kindred , who verbally acknowledged these results. Electronically Signed   By: Jeannine Boga M.D.   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[place])  Disorientation  Escherichia coli urinary tract infection  Closed fracture of multiple ribs of left side with routine healing, subsequent encounter  Dementia without behavioral disturbance, unspecified dementia type  Acute encephalopathy  Hyperlipidemia, unspecified hyperlipidemia type   Patient is being discharged with the following home health services:  Being admitted to skilled nursing facility in [facility]; being transported by her son's via private plane  Patient is being discharged with the following durable medical equipment:  None  Patient has been advised to f/u with their PCP in 1-2 weeks to bring them up to date on their rehab stay.  Social services at facility was responsible for arranging this appointment.  Pt was provided with a 30 day supply of prescriptions for medications and refills must be obtained from their PCP.  For controlled substances, a more limited supply may be provided adequate until PCP appointment only.   Medications have been reconciled    time spent greater than 30 minutes;> 50% of time with patient was spent reviewing records, labs, tests and studies, counseling and developing plan of care  Noah Delaine. Sheppard Coil, MD

## 2017-09-13 ENCOUNTER — Encounter: Payer: Self-pay | Admitting: Internal Medicine

## 2017-10-17 ENCOUNTER — Other Ambulatory Visit: Payer: Self-pay | Admitting: Internal Medicine

## 2019-04-10 IMAGING — CR DG CHEST 2V
2 series · 2 of 2 positions shown · non-contrast
Comparison: 08/28/2017, 08/26/2017

CLINICAL DATA: Chest pain

EXAM:
CHEST  2 VIEW

[chest lat]
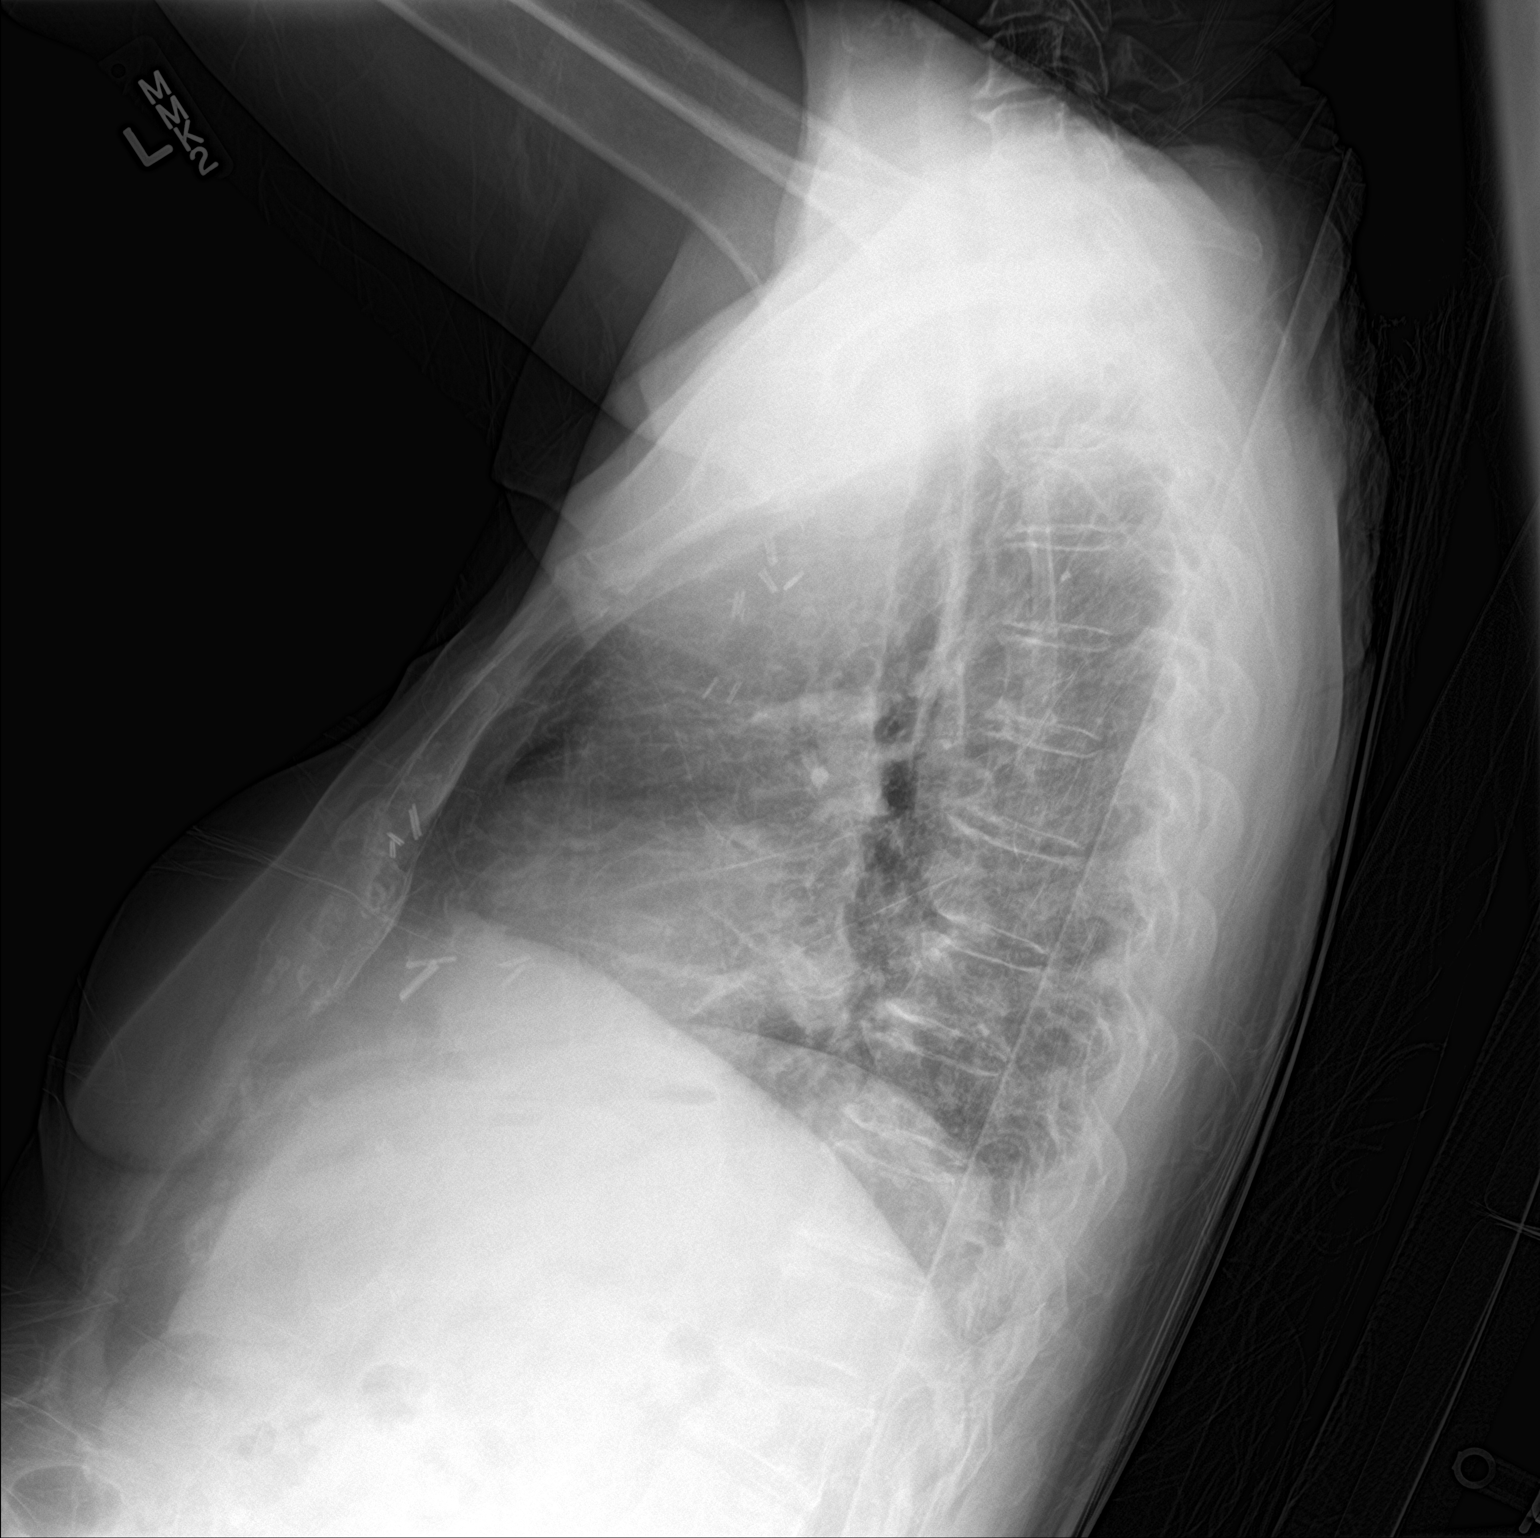

[chest ap]
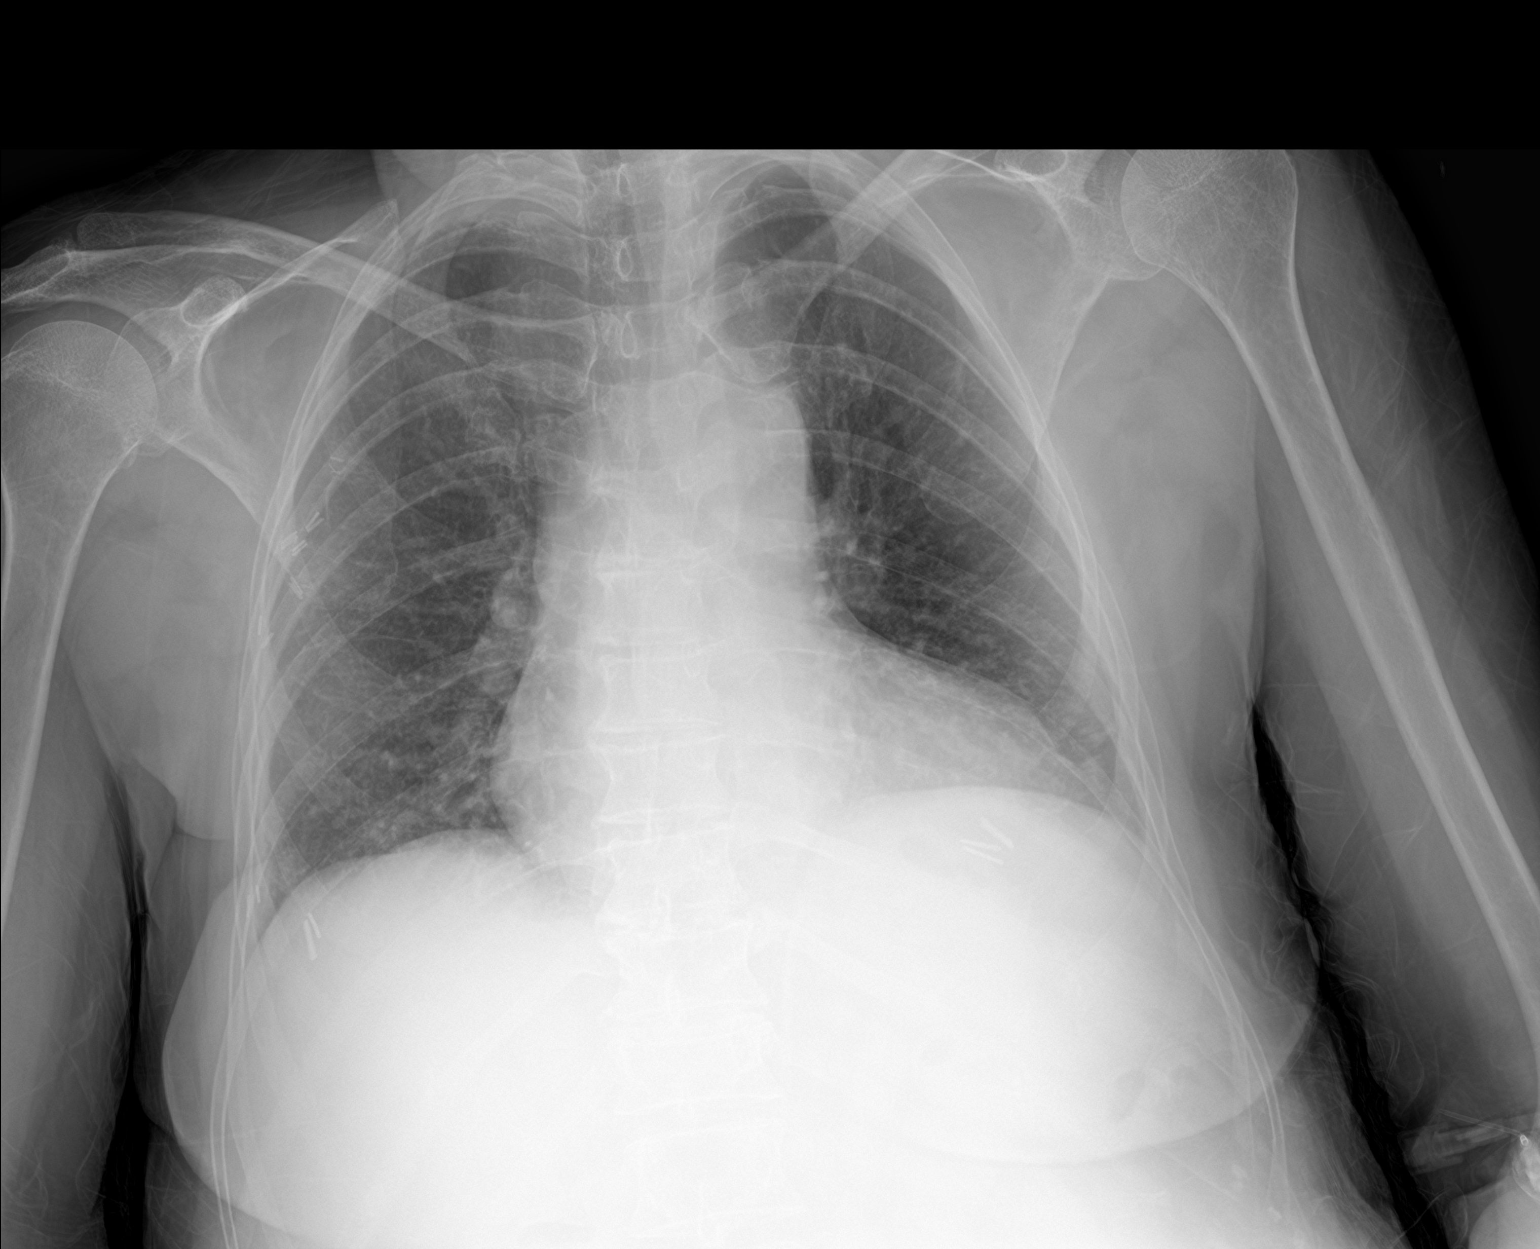

[2 of 2 positions shown; findings below may reference images not displayed]

FINDINGS: Low lung volumes. No acute consolidation or pleural effusion.
Borderline cardiomegaly, augmented by low lung volume. No
pneumothorax. Surgical clips in the left upper quadrant and right
chest. Compression deformity at T12.
IMPRESSION: Low lung volumes with borderline cardiomegaly. No infiltrate or
edema

## 2019-04-13 IMAGING — CT CT ANGIO NECK
1 of 8 series · 6 of 33 positions shown · IV contrast (OMNI 350)
Comparison: Head CT from earlier today.  Brain MRI 08/30/2017

CLINICAL DATA: Aphasia.

EXAM:
CT ANGIOGRAPHY HEAD AND NECK
TECHNIQUE: Multidetector CT imaging of the head and neck was performed using
the standard protocol during bolus administration of intravenous
contrast. Multiplanar CT image reconstructions and MIPs were
obtained to evaluate the vascular anatomy. Carotid stenosis
measurements (when applicable) are obtained utilizing NASCET
criteria, using the distal internal carotid diameter as the
denominator.
CONTRAST:  50 cc Isovue 370 intravenous

[Series 7: cta neck axial · axial · 0.39mm/px · z∈[-265,-5]mm · 6 of 366 slices shown]
[im 53/366  soft-tissue]
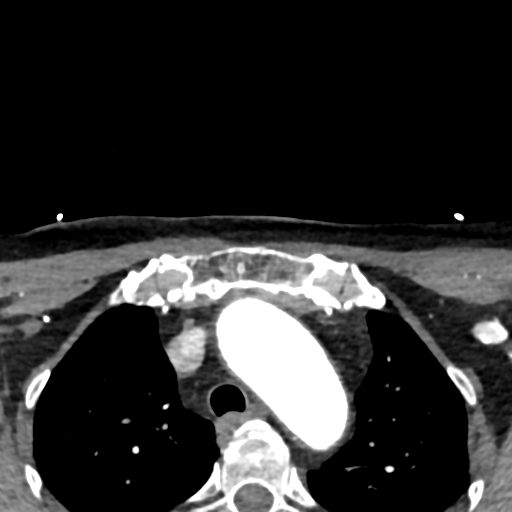
[im 105/366  bone]
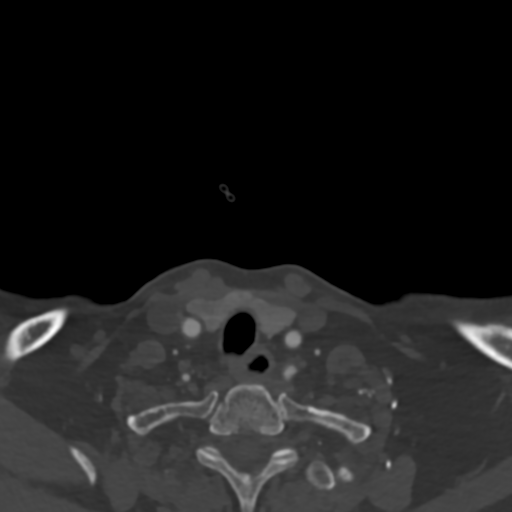
[im 157/366  soft-tissue]
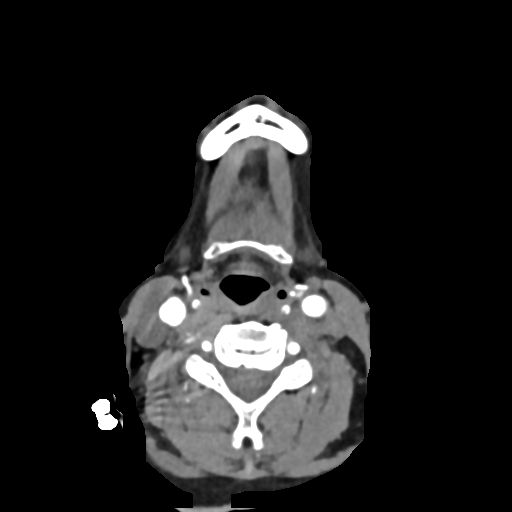
[im 209/366  bone]
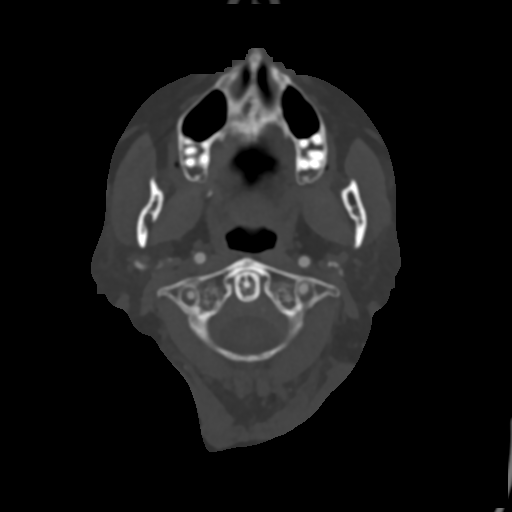
[im 261/366  soft-tissue]
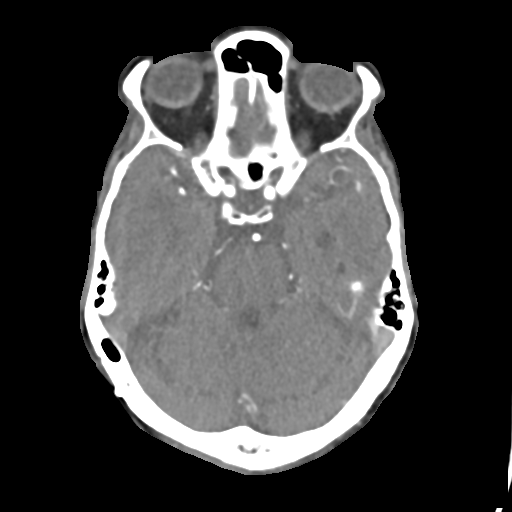
[im 313/366  bone]
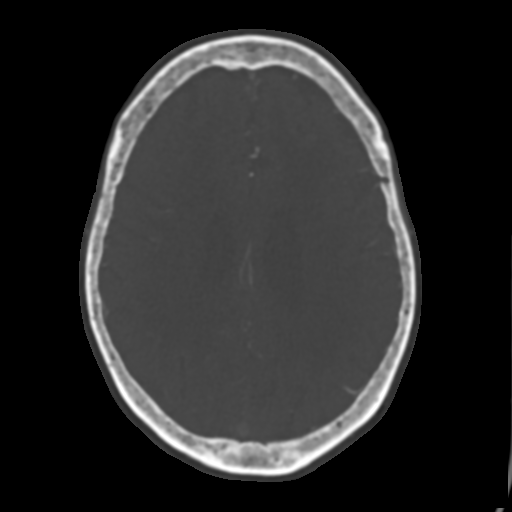

[6 of 33 positions shown; findings below may reference images not displayed]

FINDINGS: CTA NECK FINDINGS

Aortic arch: Atherosclerosis. No acute finding. Three vessel
branching.

Right carotid system: There is mild atheromatous wall thickening of
the common carotid artery. Vessels are smooth and widely patent.
Mild ICA tortuosity.

Left carotid system: Vessels are smooth and widely patent. No
notable atheromatous changes.

Vertebral arteries: Proximal subclavian atherosclerosis without flow
limiting stenosis. Mild atheromatous narrowing of the right V1
segment. No high-grade stenosis, ulceration, or dissection.

Skeleton: No acute or aggressive finding. Right retromastoid
craniotomy, reportedly for brain metastasis resection.

Other neck: No incidental mass or adenopathy.

Upper chest: Subpleural reticulation in the right upper lobe, likely
from radiotherapy. Partly seen changes of right axillary dissection.

Review of the MIP images confirms the above findings

CTA HEAD FINDINGS

Anterior circulation: No branch occlusion or flow limiting stenosis.
Mild bilateral M2 segment narrowings, most notable in the left MCA
inferior division. Negative for aneurysm or beading.

Posterior circulation: Essentially codominant vertebral arteries
that are tortuous. Hypoplastic right P1 segment. Symmetric good
opacification of bilateral posterior cerebral arteries. Unremarkable
bilateral cerebellar arteries.

Venous sinuses: Patent on the delayed phase.

Anatomic variants: As above

Delayed phase: No abnormal intracranial enhancement to suggest
recurrent metastatic disease. There is encephalomalacia deep to the
right retromastoid craniotomy, reported site of brain metastasis
treatment. Confluent low-density in the cerebral white matter with
central atrophy and ventriculomegaly, suspected sequela of
whole-brain radiotherapy.

Case reviewed in person with Dr. Yukifumi.

Review of the MIP images confirms the above findings
IMPRESSION: 1. No emergent large vessel occlusion.
2. Overall mild atherosclerosis. No flow limiting stenosis in the
head or neck.
3. History of brain metastases with surgery and radiation. No
evidence of active metastatic disease on postcontrast scan.

## 2020-06-09 DEATH — deceased
